# Patient Record
Sex: Male | Born: 1960
Health system: Southern US, Community
[De-identification: ages and names within clinical notes are randomized; demographics above are authoritative.]

## PROBLEM LIST (undated history)

## (undated) DIAGNOSIS — N200 Calculus of kidney: Secondary | ICD-10-CM

## (undated) DIAGNOSIS — I10 Essential (primary) hypertension: Secondary | ICD-10-CM

## (undated) DIAGNOSIS — N189 Chronic kidney disease, unspecified: Secondary | ICD-10-CM

## (undated) HISTORY — DX: Essential (primary) hypertension: I10

## (undated) HISTORY — DX: Chronic kidney disease, unspecified: N18.9

---

## 1999-12-13 ENCOUNTER — Emergency Department (HOSPITAL_COMMUNITY): Admission: EM | Admit: 1999-12-13 | Discharge: 1999-12-13 | Payer: Self-pay | Admitting: Emergency Medicine

## 1999-12-14 ENCOUNTER — Emergency Department (HOSPITAL_COMMUNITY): Admission: EM | Admit: 1999-12-14 | Discharge: 1999-12-14 | Payer: Self-pay | Admitting: Emergency Medicine

## 1999-12-17 ENCOUNTER — Encounter: Admission: RE | Admit: 1999-12-17 | Discharge: 1999-12-17 | Payer: Self-pay | Admitting: Hematology and Oncology

## 2007-10-06 ENCOUNTER — Emergency Department (HOSPITAL_BASED_OUTPATIENT_CLINIC_OR_DEPARTMENT_OTHER): Admission: EM | Admit: 2007-10-06 | Discharge: 2007-10-06 | Payer: Self-pay | Admitting: Emergency Medicine

## 2008-04-18 ENCOUNTER — Encounter: Admission: RE | Admit: 2008-04-18 | Discharge: 2008-04-18 | Payer: Self-pay | Admitting: Occupational Medicine

## 2010-10-29 LAB — DIFFERENTIAL
Basophils Absolute: 0.1
Basophils Relative: 2 — ABNORMAL HIGH
Eosinophils Absolute: 0.1
Eosinophils Relative: 1
Lymphocytes Relative: 13

## 2010-10-29 LAB — CBC
HCT: 46.9
MCHC: 35.1
MCV: 83.5
Platelets: 160
RDW: 12.2

## 2010-10-29 LAB — URINALYSIS, ROUTINE W REFLEX MICROSCOPIC
Bilirubin Urine: NEGATIVE
Ketones, ur: NEGATIVE
Leukocytes, UA: NEGATIVE
Nitrite: NEGATIVE
Protein, ur: 30 — AB
Urobilinogen, UA: 1

## 2010-10-29 LAB — BASIC METABOLIC PANEL
BUN: 18
CO2: 29
GFR calc non Af Amer: 55 — ABNORMAL LOW
Glucose, Bld: 144 — ABNORMAL HIGH
Potassium: 4.1

## 2011-11-09 DIAGNOSIS — E785 Hyperlipidemia, unspecified: Secondary | ICD-10-CM | POA: Insufficient documentation

## 2011-11-09 DIAGNOSIS — Z01810 Encounter for preprocedural cardiovascular examination: Secondary | ICD-10-CM | POA: Insufficient documentation

## 2011-11-09 DIAGNOSIS — I10 Essential (primary) hypertension: Secondary | ICD-10-CM | POA: Insufficient documentation

## 2017-04-01 ENCOUNTER — Ambulatory Visit: Payer: Self-pay | Admitting: Urgent Care

## 2017-04-01 ENCOUNTER — Emergency Department (HOSPITAL_COMMUNITY): Payer: Worker's Compensation

## 2017-04-01 ENCOUNTER — Encounter (HOSPITAL_COMMUNITY): Payer: Self-pay

## 2017-04-01 ENCOUNTER — Emergency Department (HOSPITAL_COMMUNITY)
Admission: EM | Admit: 2017-04-01 | Discharge: 2017-04-01 | Disposition: A | Discharge status: Home / Self Care | Payer: Worker's Compensation | Attending: Emergency Medicine | Admitting: Emergency Medicine

## 2017-04-01 DIAGNOSIS — Y939 Activity, unspecified: Secondary | ICD-10-CM | POA: Diagnosis not present

## 2017-04-01 DIAGNOSIS — W230XXA Caught, crushed, jammed, or pinched between moving objects, initial encounter: Secondary | ICD-10-CM | POA: Diagnosis not present

## 2017-04-01 DIAGNOSIS — Z23 Encounter for immunization: Secondary | ICD-10-CM | POA: Insufficient documentation

## 2017-04-01 DIAGNOSIS — S67192A Crushing injury of right middle finger, initial encounter: Secondary | ICD-10-CM | POA: Diagnosis present

## 2017-04-01 DIAGNOSIS — Y990 Civilian activity done for income or pay: Secondary | ICD-10-CM | POA: Diagnosis not present

## 2017-04-01 DIAGNOSIS — S6710XA Crushing injury of unspecified finger(s), initial encounter: Secondary | ICD-10-CM

## 2017-04-01 DIAGNOSIS — Y929 Unspecified place or not applicable: Secondary | ICD-10-CM | POA: Diagnosis not present

## 2017-04-01 MED ORDER — CEPHALEXIN 500 MG PO CAPS: 500.0000 mg | ORAL_CAPSULE | Freq: Two times a day (BID) | ORAL | 0 refills | Status: DC

## 2017-04-01 MED ORDER — BUPIVACAINE HCL (PF) 0.5 % IJ SOLN
20.0000 mL | Freq: Once | INTRAMUSCULAR | Status: AC
  Administered 2017-04-01: 20 mL
  Filled 2017-04-01: qty 20

## 2017-04-01 MED ORDER — TETANUS-DIPHTH-ACELL PERTUSSIS 5-2.5-18.5 LF-MCG/0.5 IM SUSP
0.5000 mL | Freq: Once | INTRAMUSCULAR | Status: AC
  Administered 2017-04-01: 0.5 mL via INTRAMUSCULAR
  Filled 2017-04-01: qty 0.5

## 2017-04-01 MED ORDER — PIPERACILLIN-TAZOBACTAM 3.375 G IVPB: 3.3750 g | Freq: Three times a day (TID) | INTRAVENOUS | Status: DC

## 2017-04-01 MED ORDER — OXYCODONE-ACETAMINOPHEN 5-325 MG PO TABS
1.0000 | ORAL_TABLET | ORAL | 0 refills | Status: DC | PRN
Start: 2017-04-01 — End: 2018-10-25

## 2017-04-01 MED ORDER — LIDOCAINE HCL 2 % IJ SOLN
20.0000 mL | Freq: Once | INTRAMUSCULAR | Status: AC
  Administered 2017-04-01: 400 mg
  Filled 2017-04-01: qty 20

## 2017-04-01 MED ORDER — PIPERACILLIN-TAZOBACTAM 3.375 G IVPB 30 MIN
3.3750 g | Freq: Once | INTRAVENOUS | Status: AC
Start: 2017-04-01 — End: 2017-04-01
  Administered 2017-04-01: 3.375 g via INTRAVENOUS
  Filled 2017-04-01: qty 50

## 2017-04-01 NOTE — H&P (Signed)
Reason for Consult:finger injury Referring Physician: ER  CC:I got my finger caught in a press  HPI:  John Morrison is an 57 y.o. rihgt handed male who presents with  Near amputation of RLF . Pt got finger caught in a pressing machine at work this afternoon .  Pain is rated at   10 /10 and is described as sharp.  Pain is constant.  Pain is made better by rest/immobilization, worse with motion.   Associated signs/symptoms: denies other injuries Previous treatment:    History reviewed. No pertinent past medical history.  History reviewed. No pertinent surgical history.  No family history on file.  Social History:  reports that  has never smoked. he has never used smokeless tobacco. He reports that he drinks alcohol. He reports that he does not use drugs.  Allergies: No Known Allergies  Medications: I have reviewed the patient's current medications.  No results found for this or any previous visit (from the past 48 hour(s)).  Dg Finger Middle Right  Result Date: 04/01/2017 CLINICAL DATA:  Patient with middle finger pain. Initial encounter. Injury. EXAM: RIGHT MIDDLE FINGER 2+V COMPARISON:  None. FINDINGS: Transverse fracture through the distal aspect of the distal phalanx of the middle finger with overlying soft tissue injury. There is mild volar displacement of the distal fracture fragment. IMPRESSION: Displaced transverse fracture through the distal aspect of the distal phalanx of the middle finger with overlying soft tissue injury. Electronically Signed   By: Lovey Newcomer M.D.   On: 04/01/2017 14:56    Pertinent items are noted in HPI. Temp:  [98.3 F (36.8 C)] 98.3 F (36.8 C) (03/07 1419) Pulse Rate:  [102] 102 (03/07 1419) Resp:  [16] 16 (03/07 1419) BP: (174)/(103) 174/103 (03/07 1419) SpO2:  [100 %] 100 % (03/07 1419) Weight:  [84.4 kg (186 lb)] 84.4 kg (186 lb) (03/07 1419) General appearance: alert and cooperative Resp: clear to auscultation bilaterally Cardio: regular rate  and rhythm GI: soft, non-tender; bowel sounds normal; no masses,  no organomegaly Extremities: extremities normal, atraumatic, no cyanosis or edema Except for RLF, with near amputation thru nail plate, finger tip hanging on by volar skin, open fracture of distal phalanx; skin flap pink  Assessment: Near amputation , crush injury to right long finger Plan: Will explore wound, fracture fixation, nail bed repair,  Finger tip repair I have discussed this treatment plan in detail with patient, including the risks of the recommended treatment or surgery, the benefits and the alternatives.  The patient understands that additional treatment may be necessary.  [place] C Kenneisha Cochrane 04/01/2017, 4:50 PM

## 2017-04-01 NOTE — Discharge Instructions (Signed)
Discharge Instructions:  Keep your dressing clean, dry and in place until instructed to remove by Dr. Kellan Raffield.  If the dressing becomes dirty or wet call the office for instructions during business hours. Elevate the extremity to help with swelling, this will also help with any discomfort. Take your medication as prescribed. No lifting with the injured  extremity. If you feel that the dressing is too tight, you may loosen it, but keep it on; finger tips should be pink; if there is a concern, call the office. [phone] Ice may be used if the injury is a fracture, do not apply ice directly to the skin. Please call the office on the next business day after discharge to arrange a follow up appointment.  Call [phone] between the hours of 9am - 5pm M-Th or 9am - 1pm on Fri. For most hand injuries and/or conditions, you may return to work using the uninjured hand (one handed duty) within 24-72 hours.  A detailed note will be provided to you at your follow up appointment or may contact the office prior to your follow up.    

## 2017-04-01 NOTE — ED Provider Notes (Signed)
[facility] EMERGENCY DEPARTMENT Provider Note   CSN: [id number] Arrival date & time: 04/01/17  1414     History   Chief Complaint Chief Complaint  Patient presents with  . Finger Injury    HPI John Morrison is a 57 y.o. male.  HPI   57 year old male presents today with complaints of injury to the right middle finger.  Patient notes he was at work and caught the distal aspect of his finger in a press.  He notes this happened at approximately 1:00.  Patient notes he is right-hand dominant, he had lunch at approximately 1230.  Uncertain tetanus status.   History reviewed. No pertinent past medical history.  There are no active problems to display for this patient.   History reviewed. No pertinent surgical history.     Home Medications    Prior to Admission medications   Medication Sig Start Date End Date Taking? Authorizing Provider  cephALEXin (KEFLEX) 500 MG capsule Take 1 capsule (500 mg total) by mouth 2 (two) times daily. 04/01/17   Asier Desroches, Dellis Filbert, PA-C  oxyCODONE-acetaminophen (PERCOCET/ROXICET) 5-325 MG tablet Take 1 tablet by mouth every 4 (four) hours as needed for severe pain. 04/01/17   Okey Regal, PA-C    Family History No family history on file.  Social History Social History   Tobacco Use  . Smoking status: Never Smoker  . Smokeless tobacco: Never Used  Substance Use Topics  . Alcohol use: Yes  . Drug use: No     Allergies   Patient has no known allergies.   Review of Systems Review of Systems  All other systems reviewed and are negative.    Physical Exam Updated Vital Signs BP (!) 165/98   Pulse 70   Temp 98.3 F (36.8 C) (Oral)   Resp 16   Ht 5\' 8"  (1.727 m)   Wt 84.4 kg (186 lb)   SpO2 100%   BMI 28.28 kg/m   Physical Exam  Constitutional: He is oriented to person, place, and time. He appears well-developed and well-nourished.  HENT:  Head: Normocephalic and atraumatic.  Eyes: Conjunctivae are normal.  Pupils are equal, round, and reactive to light. Right eye exhibits no discharge. Left eye exhibits no discharge. No scleral icterus.  Neck: Normal range of motion. No JVD present. No tracheal deviation present.  Pulmonary/Chest: Effort normal. No stridor.  Musculoskeletal:  See picture, DIP extension and flexion intact  Neurological: He is alert and oriented to person, place, and time. Coordination normal.  Psychiatric: He has a normal mood and affect. His behavior is normal. Judgment and thought content normal.  Nursing note and vitals reviewed.            ED Treatments / Results  Labs (all labs ordered are listed, but only abnormal results are displayed) Labs Reviewed - No data to display  EKG  EKG Interpretation None       Radiology Dg Finger Middle Right  Result Date: 04/01/2017 CLINICAL DATA:  Patient with middle finger pain. Initial encounter. Injury. EXAM: RIGHT MIDDLE FINGER 2+V COMPARISON:  None. FINDINGS: Transverse fracture through the distal aspect of the distal phalanx of the middle finger with overlying soft tissue injury. There is mild volar displacement of the distal fracture fragment. IMPRESSION: Displaced transverse fracture through the distal aspect of the distal phalanx of the middle finger with overlying soft tissue injury. Electronically Signed   By: Lovey Newcomer M.D.   On: 04/01/2017 14:56    Procedures .Nerve Block  Date/Time: 04/01/2017 3:15 PM Performed by: Okey Regal, PA-C Authorized by: Okey Regal, PA-C   Consent:    Consent obtained:  Verbal   Consent given by:  Patient   Risks discussed:  Allergic reaction and unsuccessful block   Alternatives discussed:  No treatment Indications:    Indications:  Pain relief Location:    Body area:  Upper extremity   Upper extremity nerve blocked: right middle finger    Laterality:  Left Pre-procedure details:    Skin preparation:  2% chlorhexidine Skin anesthesia (see MAR for exact dosages):     Skin anesthesia method:  None Procedure details (see MAR for exact dosages):    Block needle gauge:  27 G   Anesthetic injected:  Bupivacaine 0.5% w/o epi and lidocaine 2% w/o epi   Injection procedure:  Anatomic landmarks identified, incremental injection, introduced needle, anatomic landmarks palpated and negative aspiration for blood Post-procedure details:    Outcome:  Anesthesia achieved   Patient tolerance of procedure:  Tolerated well, no immediate complications   (including critical care time)  Medications Ordered in ED Medications  bupivacaine (MARCAINE) 0.5 % injection 20 mL (20 mLs Infiltration Given 04/01/17 1429)  lidocaine (XYLOCAINE) 2 % (with pres) injection 400 mg (400 mg Infiltration Given 04/01/17 1429)  Tdap (BOOSTRIX) injection 0.5 mL (0.5 mLs Intramuscular Given 04/01/17 1429)  piperacillin-tazobactam (ZOSYN) IVPB 3.375 g (0 g Intravenous Stopped 04/01/17 1714)     Initial Impression / Assessment and Plan / ED Course  I have reviewed the triage vital signs and the nursing notes.  Pertinent labs & imaging results that were available during my care of the patient were reviewed by me and considered in my medical decision making (see chart for details).      Final Clinical Impressions(s) / ED Diagnoses   Final diagnoses:  Crushing injury of finger, initial encounter    Labs:   Imaging: DG finger middle right  Consults: Hand surgery - [facility]   Therapeutics: lidocaine marcaine, zosyn   Discharge Meds: Keflex, Percocet  Assessment/Plan: 57 year old male presents today with a distal amputation of his right middle finger.  He is right-hand dominant.  Digital block was performed, wound was thoroughly cleansed and irrigated, Zosyn given.  Hand surgery consulted.  Hand surgery repaired injury at bedside recommend discharge with outpatient follow-up.  Patient given strict return precautions, verbalized understanding and agreement to today's plan.      ED  Discharge Orders        Ordered    cephALEXin (KEFLEX) 500 MG capsule  2 times daily     04/01/17 1712    oxyCODONE-acetaminophen (PERCOCET/ROXICET) 5-325 MG tablet  Every 4 hours PRN     04/01/17 1712       Okey Regal, PA-C 04/01/17 2110    Duffy Bruce, MD 04/02/17 [phone]

## 2017-04-01 NOTE — ED Notes (Signed)
Patient transported to X-ray 

## 2017-04-01 NOTE — ED Triage Notes (Signed)
Pt states he got right middle finger stuck in brake press and has partial amputation to finger. Bleeding in triage and sterile dressing applied

## 2017-04-01 NOTE — ED Provider Notes (Signed)
Patient placed in Quick Look pathway, seen and evaluated   Chief Complaint: finger injury  HPI:   57 y.o. male was at work when a brake press came down on his right middle finger causing a partial amputation of the distal aspect. Patient c/o pain and bleeding. Unsure of last tetanus.   ROS: M/S: right middle finger injury  Physical Exam:   Gen: No distress  Neuro: Awake and Alert  Skin: partial amputation of distal aspect of  the right middle finger.     Focused Exam:    Initiation of care has begun. The patient has been counseled on the process, plan, and necessity for staying for the completion/evaluation, and the remainder of the medical screening examination    Ashley Murrain, NP 04/01/17 1430    Duffy Bruce, MD 04/02/17 [phone]

## 2017-04-01 NOTE — Progress Notes (Signed)
Pharmacy Antibiotic Note  John Morrison is a 57 y.o. male admitted on 04/01/2017 with partial finger amputation.  Pharmacy has been consulted for Zosyn dosing.  Plan: Zosyn 3.375g IV q8h (4 hour infusion).  Height: 5\' 8"  (172.7 cm) Weight: 186 lb (84.4 kg) IBW/kg (Calculated) : 68.4  Temp (24hrs), Avg:98.3 F (36.8 C), Min:98.3 F (36.8 C), Max:98.3 F (36.8 C)  No results for input(s): WBC, CREATININE, LATICACIDVEN, VANCOTROUGH, VANCOPEAK, VANCORANDOM, GENTTROUGH, GENTPEAK, GENTRANDOM, TOBRATROUGH, TOBRAPEAK, TOBRARND, AMIKACINPEAK, AMIKACINTROU, AMIKACIN in the last 168 hours.  CrCl cannot be calculated (No order found.).    No Known Allergies  Thank you for allowing pharmacy to be a part of this patient's care.  John Morrison John Morrison 04/01/2017 3:18 PM

## 2018-10-25 ENCOUNTER — Other Ambulatory Visit: Payer: Self-pay

## 2018-10-25 ENCOUNTER — Encounter (HOSPITAL_BASED_OUTPATIENT_CLINIC_OR_DEPARTMENT_OTHER): Payer: Self-pay

## 2018-10-25 ENCOUNTER — Emergency Department (HOSPITAL_BASED_OUTPATIENT_CLINIC_OR_DEPARTMENT_OTHER)
Admission: EM | Admit: 2018-10-25 | Discharge: 2018-10-25 | Disposition: A | Discharge status: Home / Self Care | Payer: Self-pay | Attending: Emergency Medicine | Admitting: Emergency Medicine

## 2018-10-25 ENCOUNTER — Emergency Department (HOSPITAL_BASED_OUTPATIENT_CLINIC_OR_DEPARTMENT_OTHER): Payer: Self-pay

## 2018-10-25 DIAGNOSIS — N200 Calculus of kidney: Secondary | ICD-10-CM | POA: Insufficient documentation

## 2018-10-25 HISTORY — DX: Calculus of kidney: N20.0

## 2018-10-25 LAB — CBC
HCT: 45.5 % (ref 39.0–52.0)
Hemoglobin: 15.5 g/dL (ref 13.0–17.0)
MCH: 28.7 pg (ref 26.0–34.0)
MCHC: 34.1 g/dL (ref 30.0–36.0)
MCV: 84.1 fL (ref 80.0–100.0)
Platelets: 170 10*3/uL (ref 150–400)
RBC: 5.41 MIL/uL (ref 4.22–5.81)
RDW: 13.2 % (ref 11.5–15.5)
WBC: 9.8 10*3/uL (ref 4.0–10.5)
nRBC: 0 % (ref 0.0–0.2)

## 2018-10-25 LAB — URINALYSIS, ROUTINE W REFLEX MICROSCOPIC
Bilirubin Urine: NEGATIVE
Glucose, UA: NEGATIVE mg/dL
Ketones, ur: 15 mg/dL — AB
Leukocytes,Ua: NEGATIVE
Nitrite: NEGATIVE
Protein, ur: 30 mg/dL — AB
Specific Gravity, Urine: 1.025 (ref 1.005–1.030)
pH: 6.5 (ref 5.0–8.0)

## 2018-10-25 LAB — URINALYSIS, MICROSCOPIC (REFLEX): RBC / HPF: >50 RBC/hpf (ref 0–5)

## 2018-10-25 LAB — BASIC METABOLIC PANEL
Anion gap: 9 (ref 5–15)
BUN: 23 mg/dL — ABNORMAL HIGH (ref 6–20)
CO2: 26 mmol/L (ref 22–32)
Calcium: 9.5 mg/dL (ref 8.9–10.3)
Chloride: 104 mmol/L (ref 98–111)
Creatinine, Ser: 1.42 mg/dL — ABNORMAL HIGH (ref 0.61–1.24)
GFR calc Af Amer: >60 mL/min (ref >60)
GFR calc non Af Amer: 55 mL/min — ABNORMAL LOW (ref >60)
Glucose, Bld: 159 mg/dL — ABNORMAL HIGH (ref 70–99)
Potassium: 4.4 mmol/L (ref 3.5–5.1)
Sodium: 139 mmol/L (ref 135–145)

## 2018-10-25 MED ORDER — TAMSULOSIN HCL 0.4 MG PO CAPS: 0.4000 mg | ORAL_CAPSULE | Freq: Every day | ORAL | 0 refills | Status: DC

## 2018-10-25 MED ORDER — ONDANSETRON 4 MG PO TBDP: 4.0000 mg | ORAL_TABLET | Freq: Three times a day (TID) | ORAL | 0 refills | Status: DC | PRN

## 2018-10-25 MED ORDER — OXYCODONE-ACETAMINOPHEN 5-325 MG PO TABS: 1.0000 | ORAL_TABLET | ORAL | 0 refills | Status: DC | PRN

## 2018-10-25 MED ORDER — FENTANYL CITRATE (PF) 100 MCG/2ML IJ SOLN
100.0000 ug | Freq: Once | INTRAMUSCULAR | Status: AC
  Administered 2018-10-25: 100 ug via INTRAVENOUS
  Filled 2018-10-25: qty 2

## 2018-10-25 MED ORDER — OXYCODONE-ACETAMINOPHEN 5-325 MG PO TABS
1.0000 | ORAL_TABLET | Freq: Once | ORAL | Status: AC
  Administered 2018-10-25: 1 via ORAL
  Filled 2018-10-25: qty 1

## 2018-10-25 NOTE — ED Provider Notes (Signed)
[facility] EMERGENCY DEPARTMENT Provider Note   CSN: [id number] Arrival date & time: 10/25/18  1437     History   Chief Complaint Chief Complaint  Patient presents with  . Flank Pain    HPI John Morrison is a 58 y.o. male.     58yo M w/ h/o kidney stones who p/w L flank pain. Earlier this afternoon around 1pm, he began having L back/flank pain radiating to L side that was initially severe and constant, now coming and going in waves. He reports not being able to sit still, nothing makes it better or worse. No N/V, diarrhea, fever, cough/cold symptoms, urinary symptoms, CP, or SOB. No medications PTA.   Reports distant h/o kidney stone 2009 that passed spontaneously.   The history is provided by the patient.  Flank Pain    Past Medical History:  Diagnosis Date  . Kidney stone     There are no active problems to display for this patient.   History reviewed. No pertinent surgical history.      Home Medications    Prior to Admission medications   Medication Sig Start Date End Date Taking? Authorizing Provider  ondansetron (ZOFRAN ODT) 4 MG disintegrating tablet Take 1 tablet (4 mg total) by mouth every 8 (eight) hours as needed for nausea or vomiting. 10/25/18   Theo Reither, Wenda Overland, MD  oxyCODONE-acetaminophen (PERCOCET) 5-325 MG tablet Take 1 tablet by mouth every 4 (four) hours as needed. 10/25/18   Latisha Lasch, Wenda Overland, MD  tamsulosin (FLOMAX) 0.4 MG CAPS capsule Take 1 capsule (0.4 mg total) by mouth daily. 10/25/18   Jamica Woodyard, Wenda Overland, MD    Family History No family history on file.  Social History Social History   Tobacco Use  . Smoking status: Never Smoker  . Smokeless tobacco: Never Used  Substance Use Topics  . Alcohol use: Not Currently  . Drug use: No     Allergies   Patient has no known allergies.   Review of Systems Review of Systems  Genitourinary: Positive for flank pain.  All other systems reviewed and are negative  except that which was mentioned in HPI    Physical Exam Updated Vital Signs BP 119/77   Pulse (!) 52   Temp 98.2 F (36.8 C) (Oral)   Resp 14   Ht 5\' 8"  (1.727 m)   Wt 75.3 kg   SpO2 99%   BMI 25.24 kg/m   Physical Exam Vitals signs and nursing note reviewed.  Constitutional:      General: He is not in acute distress.    Appearance: He is well-developed.  HENT:     Head: Normocephalic and atraumatic.  Eyes:     Conjunctiva/sclera: Conjunctivae normal.  Neck:     Musculoskeletal: Neck supple.  Cardiovascular:     Rate and Rhythm: Normal rate and regular rhythm.     Heart sounds: Normal heart sounds. No murmur.  Pulmonary:     Effort: Pulmonary effort is normal.     Breath sounds: Normal breath sounds.  Abdominal:     General: Bowel sounds are normal. There is no distension.     Palpations: Abdomen is soft.     Tenderness: There is abdominal tenderness (LLQ). There is no guarding or rebound.  Skin:    General: Skin is warm and dry.  Neurological:     Mental Status: He is alert and oriented to person, place, and time.     Comments: Fluent speech  Psychiatric:  Judgment: Judgment normal.      ED Treatments / Results  Labs (all labs ordered are listed, but only abnormal results are displayed) Labs Reviewed  URINALYSIS, ROUTINE W REFLEX MICROSCOPIC - Abnormal; Notable for the following components:      Result Value   Color, Urine BROWN (*)    APPearance CLOUDY (*)    Hgb urine dipstick LARGE (*)    Ketones, ur 15 (*)    Protein, ur 30 (*)    All other components within normal limits  BASIC METABOLIC PANEL - Abnormal; Notable for the following components:   Glucose, Bld 159 (*)    BUN 23 (*)    Creatinine, Ser 1.42 (*)    GFR calc non Af Amer 55 (*)    All other components within normal limits  URINALYSIS, MICROSCOPIC (REFLEX) - Abnormal; Notable for the following components:   Bacteria, UA RARE (*)    All other components within normal limits  CBC     EKG None  Radiology Ct Renal Stone Study  Result Date: 10/25/2018 CLINICAL DATA:  LEFT flank pain radiating to abdomen beginning this afternoon suspected kidney stone disease, history kidney stones EXAM: CT ABDOMEN AND PELVIS WITHOUT CONTRAST TECHNIQUE: Multidetector CT imaging of the abdomen and pelvis was performed following the standard protocol without IV contrast. Sagittal and coronal MPR images reconstructed from axial data set. Oral contrast not administered for this indication. COMPARISON:  10/06/2007 FINDINGS: Lower chest: Lung bases clear Hepatobiliary: Gallbladder and liver normal appearance Pancreas: Normal appearance Spleen: Normal appearance Adrenals/Urinary Tract: Adrenal glands normal appearance. LEFT hydronephrosis and perinephric edema secondary to a 5 x 4 mm proximal LEFT ureteral calculus. Additional tiny nonobstructing BILATERAL renal calculi. No RIGHT hydronephrosis. No renal masses. Remaining ureters decompressed. Bladder unremarkable. Stomach/Bowel: Normal appendix. Stomach and bowel loops normal appearance. For technique. Vascular/Lymphatic: Atherosclerotic calcifications aorta and iliac arteries without aneurysm. No adenopathy. Reproductive: Unremarkable prostate gland and seminal vesicles Other: No free air or free fluid. No hernia or additional inflammatory process. Musculoskeletal: BILATERAL spondylolysis L4 with minimal grade 1 anterolisthesis L4-L5. Disc space narrowing L4-L5. IMPRESSION: LEFT hydronephrosis secondary to a 5 x 4 mm proximal LEFT ureteral calculus. Additional tiny BILATERAL nonobstructing renal calculi. Grade 1 anterolisthesis L4-L5 secondary to BILATERAL spondylolysis L4. Electronically Signed   By: Lavonia Dana M.D.   On: 10/25/2018 17:28    Procedures Procedures (including critical care time)  Medications Ordered in ED Medications  fentaNYL (SUBLIMAZE) injection 100 mcg (100 mcg Intravenous Given 10/25/18 1714)  oxyCODONE-acetaminophen  (PERCOCET/ROXICET) 5-325 MG per tablet 1 tablet (1 tablet Oral Given 10/25/18 1822)     Initial Impression / Assessment and Plan / ED Course  I have reviewed the triage vital signs and the nursing notes.  Pertinent labs & imaging results that were available during my care of the patient were reviewed by me and considered in my medical decision making (see chart for details).        Uncomfortable but nontoxic on exam, some left lower quadrant tenderness noted.  Vital signs reassuring.  UA shows blood without evidence of infection.  Creatinine today is 1.42 which is similar to creatinine of 1.4 previously.  Normal CBC.  CT renal study shows left hydronephrosis with 5 mm proximal left ureteral stone.  On reassessment after above medications, patient stated that he felt much better.  He has had no vomiting.  I discussed supportive measures and provided pain medication and nausea medication for home.  Instructed him to contact urology  for follow-up appointment.  I have reviewed return precautions and he voiced understanding.  Final Clinical Impressions(s) / ED Diagnoses   Final diagnoses:  Kidney stone    ED Discharge Orders         Ordered    oxyCODONE-acetaminophen (PERCOCET) 5-325 MG tablet  Every 4 hours PRN     10/25/18 1902    ondansetron (ZOFRAN ODT) 4 MG disintegrating tablet  Every 8 hours PRN     10/25/18 1902    tamsulosin (FLOMAX) 0.4 MG CAPS capsule  Daily     10/25/18 1902           Sarahlynn Cisnero, Wenda Overland, MD 10/25/18 2334

## 2018-10-25 NOTE — ED Triage Notes (Signed)
Pt c/o left flank pain since 1pm-states feels like a kidney stone-grimacing

## 2018-10-25 NOTE — ED Notes (Signed)
Patient transported to CT 

## 2018-10-25 NOTE — ED Notes (Signed)
Left flank pain radiating to abd onset this pm

## 2018-10-25 NOTE — ED Notes (Signed)
ED Provider at bedside. 

## 2019-04-25 LAB — CBC AND DIFFERENTIAL: WBC: 5.8

## 2020-02-15 DIAGNOSIS — Z23 Encounter for immunization: Secondary | ICD-10-CM | POA: Diagnosis not present

## 2020-03-15 DIAGNOSIS — Z23 Encounter for immunization: Secondary | ICD-10-CM | POA: Diagnosis not present

## 2020-03-18 ENCOUNTER — Encounter: Payer: Self-pay | Admitting: Family Medicine

## 2020-03-25 ENCOUNTER — Encounter: Payer: Self-pay | Admitting: Family Medicine

## 2020-03-25 DIAGNOSIS — N200 Calculus of kidney: Secondary | ICD-10-CM | POA: Insufficient documentation

## 2020-03-26 ENCOUNTER — Other Ambulatory Visit: Payer: Self-pay

## 2020-03-27 ENCOUNTER — Encounter: Payer: Self-pay | Admitting: Family Medicine

## 2020-03-27 ENCOUNTER — Ambulatory Visit (INDEPENDENT_AMBULATORY_CARE_PROVIDER_SITE_OTHER): Payer: BC Managed Care – PPO | Admitting: Family Medicine

## 2020-03-27 VITALS — BP 168/84 | HR 88 | Temp 98.0°F | Ht 64.75 in | Wt 164.0 lb

## 2020-03-27 DIAGNOSIS — I10 Essential (primary) hypertension: Secondary | ICD-10-CM | POA: Diagnosis not present

## 2020-03-27 DIAGNOSIS — Z125 Encounter for screening for malignant neoplasm of prostate: Secondary | ICD-10-CM

## 2020-03-27 DIAGNOSIS — Z0000 Encounter for general adult medical examination without abnormal findings: Secondary | ICD-10-CM | POA: Diagnosis not present

## 2020-03-27 DIAGNOSIS — Z1211 Encounter for screening for malignant neoplasm of colon: Secondary | ICD-10-CM

## 2020-03-27 DIAGNOSIS — Z131 Encounter for screening for diabetes mellitus: Secondary | ICD-10-CM

## 2020-03-27 DIAGNOSIS — Z7689 Persons encountering health services in other specified circumstances: Secondary | ICD-10-CM | POA: Diagnosis not present

## 2020-03-27 DIAGNOSIS — G8929 Other chronic pain: Secondary | ICD-10-CM | POA: Insufficient documentation

## 2020-03-27 DIAGNOSIS — E782 Mixed hyperlipidemia: Secondary | ICD-10-CM | POA: Diagnosis not present

## 2020-03-27 DIAGNOSIS — M545 Low back pain, unspecified: Secondary | ICD-10-CM | POA: Insufficient documentation

## 2020-03-27 DIAGNOSIS — M4316 Spondylolisthesis, lumbar region: Secondary | ICD-10-CM | POA: Insufficient documentation

## 2020-03-27 MED ORDER — LISINOPRIL 20 MG PO TABS: 20.0000 mg | ORAL_TABLET | Freq: Every day | ORAL | 0 refills | Status: DC

## 2020-03-27 NOTE — Patient Instructions (Signed)

## 2020-03-27 NOTE — Progress Notes (Signed)
Patient ID: John Morrison, male  DOB: 15-Jul-1960, 60 y.o.   MRN: [id number] Patient Care Team    Relationship Specialty Notifications Start End  Ma Hillock, DO PCP - General Family Medicine  03/27/20     Chief Complaint  Patient presents with  . Establish Care    Pt dx with Pre-hypertension never on meds; No refills at this time     Subjective:  John Morrison is a 60 y.o.  male present for new patient establishment/cmc. All past medical history, surgical history, allergies, family history, immunizations, medications and social history were updated in the electronic medical record today. All recent labs, ED visits and hospitalizations within the last year were reviewed.  Health maintenance: pt adopted at age 25- not much fhx is known for certain.  Colonoscopy: never. Counseled> pt desires cologuard. He understands if positive test result will need colonoscopy.  Immunizations: tdap UTD 2019, Influenza declined (encouraged yearly), covid series completed,  shingles declined for now Infectious disease screening: HIV and  Hep C declined today PSA: No results found for: PSA Assistive device: none Oxygen UDJ:SHFW Patient has a Dental home. Hospitalizations/ED visits:reviewed  Depression screen [facility] 2/9 03/27/2020  Decreased Interest 0  Down, Depressed, Hopeless 0  PHQ - 2 Score 0   No flowsheet data found.     No flowsheet data found.  Immunization History  Administered Date(s) Administered  . Moderna Sars-Covid-2 Vaccination 02/15/2020, 03/15/2020  . Tdap 04/01/2017   No exam data present  Past Medical History:  Diagnosis Date  . CKD (chronic kidney disease)   . Hypertension   . Kidney stone    No Known Allergies Past Surgical History:  Procedure Laterality Date  . NO PAST SURGERIES     Family History  Adopted: Yes  Family history unknown: Yes   Social History   Social History Narrative   Marital status/children/pets: Married   Education/employment: High  school education, works as a Glass blower/designer.   Safety:      -smoke alarm in the home:Yes     - wears seatbelt: Yes     - Feels safe in their relationships: Yes    Allergies as of 03/27/2020   No Known Allergies     Medication List       Accurate as of March 27, 2020  2:06 PM. If you have any questions, ask your nurse or doctor.        STOP taking these medications   ondansetron 4 MG disintegrating tablet Commonly known as: Zofran ODT Stopped by: Howard Pouch, DO   oxyCODONE-acetaminophen 5-325 MG tablet Commonly known as: Percocet Stopped by: Howard Pouch, DO   tamsulosin 0.4 MG Caps capsule Commonly known as: FLOMAX Stopped by: Howard Pouch, DO     TAKE these medications   lisinopril 20 MG tablet Commonly known as: ZESTRIL Take 1 tablet (20 mg total) by mouth daily. Started by: Howard Pouch, DO       All past medical history, surgical history, allergies, family history, immunizations andmedications were updated in the EMR today and reviewed under the history and medication portions of their EMR.    No results found for this or any previous visit (from the past 2160 hour(s)).   ROS: 14 pt review of systems performed and negative (unless mentioned in an HPI)  Objective: BP (!) 168/84   Pulse 88   Temp 98 F (36.7 C) (Oral)   Ht 5' 4.75" (1.645 m)   Wt 164  lb (74.4 kg)   SpO2 98%   BMI 27.50 kg/m  Gen: Afebrile. No acute distress. Nontoxic in appearance, well-developed, well-nourished,  Pleasant male.  HENT: AT. [place]. poor dentition.  Bilateral TM visualized and normal in appearance, normal external auditory canal. MMM, no oral lesions, adequate dentition. Bilateral nares within normal limits. Throat without erythema, ulcerations or exudates. no Cough on exam, no hoarseness on exam. Eyes:Pupils Equal Round Reactive to light, Extraocular movements intact,  Conjunctiva without redness, discharge or icterus. Neck/lymp/endocrine: Supple,no lymphadenopathy, no  thyromegaly CV: RRR no murmur, no edema, +2/4 P posterior tibialis pulses.  Chest: CTAB, no wheeze, rhonchi or crackles. normal Respiratory effort. good Air movement. Abd: Soft. falt. NTND. BS present. no Masses palpated. No hepatosplenomegaly. No rebound tenderness or guarding. Skin: no rashes, purpura or petechiae. Warm and well-perfused. Skin intact. Neuro/Msk:  Normal gait. PERLA. EOMi. Alert. Oriented x3.  Cranial nerves II through XII intact. Muscle strength 5/5 upper/lower extremity. DTRs equal bilaterally. Psych: Normal affect, dress and demeanor. Normal speech. Normal thought content and judgment.   Assessment/plan: John Morrison is a 60 y.o. male present for est care/cpe/CMC Establishing care with new doctor, encounter for Primary hypertension/HLD Start lisinopril 20 mg QD.  Low sodium diet- heart healthy Encouraged him to establish with dental team for poor dentition.  - CBC w/Diff - Comp Met (CMET) - TSH - Lipid panel - 3-4 weeks follow for BP recheck.   Diabetes mellitus screening - Hemoglobin A1c Prostate cancer screening - PSA Colon cancer screening - Cologuard  Anterolisthesis of lumbar spine/Spondylolisthesis of lumbar region/Chronic bilateral low back pain without sciatica He had FMLA paperwork filled out in the past by [employer] (many years ago). Agreed to fill out for him.  Routine general medical examination at a health care facility Patient was encouraged to exercise greater than 150 minutes a week. Patient was encouraged to choose a diet filled with fresh fruits and vegetables, and lean meats. AVS provided to patient today for education/recommendation on gender specific health and safety maintenance. Colonoscopy: never. Counseled> pt desires cologuard. He understands if positive test result will need colonoscopy.  Immunizations: tdap UTD 2019, Influenza declined (encouraged yearly), covid series completed,  shingles declined for now Infectious disease screening:  HIV and  Hep C declined today PSA: No results found for: PSA  Return in about 3 weeks (around 04/17/2020) for CMC (30 min).  Orders Placed This Encounter  Procedures  . CBC and differential  . PSA  . CBC w/Diff  . Comp Met (CMET)  . TSH  . Lipid panel  . Hemoglobin A1c  . Cologuard   Meds ordered this encounter  Medications  . lisinopril (ZESTRIL) 20 MG tablet    Sig: Take 1 tablet (20 mg total) by mouth daily.    Dispense:  90 tablet    Refill:  0   Referral Orders  No referral(s) requested today     Note is dictated utilizing voice recognition software. Although note has been proof read prior to signing, occasional typographical errors still can be missed. If any questions arise, please do not hesitate to call for verification.  Electronically signed by: Howard Pouch, DO [place]

## 2020-03-28 LAB — COMPREHENSIVE METABOLIC PANEL
AG Ratio: 1.6 (calc) (ref 1.0–2.5)
ALT: 13 U/L (ref 9–46)
AST: 14 U/L (ref 10–35)
Albumin: 4.3 g/dL (ref 3.6–5.1)
Alkaline phosphatase (APISO): 65 U/L (ref 35–144)
BUN: 24 mg/dL (ref 7–25)
CO2: 25 mmol/L (ref 20–32)
Calcium: 9.6 mg/dL (ref 8.6–10.3)
Chloride: 106 mmol/L (ref 98–110)
Creat: 1.1 mg/dL (ref 0.70–1.33)
Globulin: 2.7 g/dL (calc) (ref 1.9–3.7)
Glucose, Bld: 79 mg/dL (ref 65–99)
Potassium: 3.9 mmol/L (ref 3.5–5.3)
Sodium: 141 mmol/L (ref 135–146)
Total Bilirubin: 0.5 mg/dL (ref 0.2–1.2)
Total Protein: 7 g/dL (ref 6.1–8.1)

## 2020-03-28 LAB — CBC WITH DIFFERENTIAL/PLATELET
Absolute Monocytes: 332 cells/uL (ref 200–950)
Basophils Absolute: 61 cells/uL (ref 0–200)
Basophils Relative: 1.2 %
Eosinophils Absolute: 143 cells/uL (ref 15–500)
Eosinophils Relative: 2.8 %
HCT: 44.2 % (ref 38.5–50.0)
Hemoglobin: 15.5 g/dL (ref 13.2–17.1)
Lymphs Abs: 1346 cells/uL (ref 850–3900)
MCH: 29 pg (ref 27.0–33.0)
MCHC: 35.1 g/dL (ref 32.0–36.0)
MCV: 82.6 fL (ref 80.0–100.0)
MPV: 12.3 fL (ref 7.5–12.5)
Monocytes Relative: 6.5 %
Neutro Abs: 3218 cells/uL (ref 1500–7800)
Neutrophils Relative %: 63.1 %
Platelets: 158 10*3/uL (ref 140–400)
RBC: 5.35 10*6/uL (ref 4.20–5.80)
RDW: 13.7 % (ref 11.0–15.0)
Total Lymphocyte: 26.4 %
WBC: 5.1 10*3/uL (ref 3.8–10.8)

## 2020-03-28 LAB — LIPID PANEL
Cholesterol: 146 mg/dL (ref <200)
HDL: 40 mg/dL (ref >=40)
LDL Cholesterol (Calc): 84 mg/dL (calc)
Non-HDL Cholesterol (Calc): 106 mg/dL (calc) (ref <130)
Total CHOL/HDL Ratio: 3.7 (calc) (ref <5.0)
Triglycerides: 127 mg/dL (ref <150)

## 2020-03-28 LAB — PSA: PSA: 0.57 ng/mL (ref <=4.0)

## 2020-03-28 LAB — HEMOGLOBIN A1C
Hgb A1c MFr Bld: 4.9 % of total Hgb (ref <5.7)
Mean Plasma Glucose: 94 mg/dL
eAG (mmol/L): 5.2 mmol/L

## 2020-03-28 LAB — TSH: TSH: 2.54 mIU/L (ref 0.40–4.50)

## 2020-03-29 ENCOUNTER — Telehealth: Payer: Self-pay | Admitting: Family Medicine

## 2020-03-29 NOTE — Telephone Encounter (Signed)
Filled out appropriate portion and placed on PCP desk

## 2020-03-29 NOTE — Telephone Encounter (Signed)
Patient's wife dropped off FMLA forms to be filled out. Placed in Dr. Lucita Lora inbox with signed request forms.

## 2020-04-04 DIAGNOSIS — Z0279 Encounter for issue of other medical certificate: Secondary | ICD-10-CM

## 2020-04-04 NOTE — Telephone Encounter (Signed)
Completed FMLA forms to the best of my ability.  Paperwork was placed in [place] work [employer].  Please provide patient if his original past medical history papers he supplied to [place].

## 2020-04-05 NOTE — Telephone Encounter (Signed)
Not currently in office. Please advise

## 2020-04-05 NOTE — Telephone Encounter (Signed)
FMLA forms faxed and original paperwork mailed to pt.

## 2020-04-16 DIAGNOSIS — Z1211 Encounter for screening for malignant neoplasm of colon: Secondary | ICD-10-CM | POA: Diagnosis not present

## 2020-04-17 ENCOUNTER — Ambulatory Visit (INDEPENDENT_AMBULATORY_CARE_PROVIDER_SITE_OTHER): Payer: BC Managed Care – PPO

## 2020-04-17 ENCOUNTER — Other Ambulatory Visit: Payer: Self-pay

## 2020-04-17 ENCOUNTER — Ambulatory Visit: Payer: BC Managed Care – PPO | Admitting: Family Medicine

## 2020-04-17 VITALS — BP 148/84 | HR 56

## 2020-04-17 DIAGNOSIS — I10 Essential (primary) hypertension: Secondary | ICD-10-CM | POA: Diagnosis not present

## 2020-04-17 NOTE — Patient Instructions (Signed)
Your Blood Pressure was 164/84 last time.  Slowly improving we will call you with other instructions

## 2020-04-17 NOTE — Progress Notes (Signed)
John Morrison is a 60 y.o. male presents to the office today for blood pressure check with a BP of 148/84 and pulse 56. Last OV pt started  lisinopril 20 mg QD and had taken BP meds approx. 3 hours prior to visit with no complaints. Pt will await from further instructions from provider for any changes.  O: BP (!) 148/84   Pulse (!) 56   A/P: John Morrison is a 60 y.o. male present for BP check after starting Lisinopril 20 mg QD for new onset hypertension on his new patient establishment 03/27/2020. Benign hypertension: -BP much improved, still above goal of < 130/80. -Patient instructed to increase lisinopril to 40 mg daily.  He can finish the prescription bottle he currently has by taking 2 tabs daily in the morning.  New prescription called in for lisinopril 40 mg tab-1 tab daily with new prescription. -Follow-up mid- September for chronic medical condition follow-up.  Return in about 6 months (around 10/07/2020) for [place] (30 min).  Meds ordered this encounter  Medications  . lisinopril (ZESTRIL) 40 MG tablet    Sig: Take 1 tablet (40 mg total) by mouth daily.    Dispense:  90 tablet    Refill:  1    Electronically Signed by: Howard Pouch, DO [place] [facility]- OR  _______________________________________________________ [name]: Please call with above instructions.  Thanks.

## 2020-04-18 ENCOUNTER — Telehealth: Payer: Self-pay

## 2020-04-18 MED ORDER — LISINOPRIL 40 MG PO TABS: 40.0000 mg | ORAL_TABLET | Freq: Every day | ORAL | 1 refills | Status: DC

## 2020-04-18 NOTE — Telephone Encounter (Signed)
error 

## 2020-04-18 NOTE — Addendum Note (Signed)
Addended by: Howard Pouch A on: 04/18/2020 07:39 AM   Modules accepted: Orders, Level of Service

## 2020-04-18 NOTE — Progress Notes (Signed)
LVM for pt to CB for instructions

## 2020-04-18 NOTE — Progress Notes (Signed)
Spoke with pt with provider instructions

## 2020-04-21 LAB — COLOGUARD: Cologuard: POSITIVE — AB

## 2020-04-22 ENCOUNTER — Telehealth: Payer: Self-pay | Admitting: Family Medicine

## 2020-04-22 DIAGNOSIS — R195 Other fecal abnormalities: Secondary | ICD-10-CM

## 2020-04-22 NOTE — Telephone Encounter (Signed)
LM for pt to return call to discuss.  

## 2020-04-22 NOTE — Telephone Encounter (Signed)
Please inform patient of positive cologuard testing. A positive result does not necessarily mean cancer. It means that Cologuard detected DNA and/or  biomarkers in the stool which are associated with colon cancer or precancer. Patients with a positive result should have a diagnostic colonoscopy. Not investigating further now, could result in missed opportunity to diagnose and treat colon cancer or precancer.  I have referred him to gastroenterology office in [facility].  He should be receiving a call from them to establish.

## 2020-04-23 NOTE — Telephone Encounter (Signed)
Spoke with pt regarding labs and instructions.   

## 2020-04-24 ENCOUNTER — Encounter: Payer: Self-pay | Admitting: Internal Medicine

## 2020-05-30 ENCOUNTER — Telehealth: Payer: Self-pay | Admitting: *Deleted

## 2020-05-30 ENCOUNTER — Ambulatory Visit: Payer: BC Managed Care – PPO | Admitting: Internal Medicine

## 2020-05-30 ENCOUNTER — Other Ambulatory Visit: Payer: Self-pay

## 2020-05-30 ENCOUNTER — Encounter: Payer: Self-pay | Admitting: Internal Medicine

## 2020-05-30 VITALS — BP 146/88 | HR 57 | Temp 97.7°F | Ht 65.0 in | Wt 165.0 lb

## 2020-05-30 DIAGNOSIS — R195 Other fecal abnormalities: Secondary | ICD-10-CM

## 2020-05-30 NOTE — Telephone Encounter (Signed)
Called pt, LMOVM to call back to scheduled TCS with propofol, Dr. Abbey Chatters, ASA 2

## 2020-05-30 NOTE — Progress Notes (Signed)
Primary Care Physician:  Ma Hillock, DO Primary Gastroenterologist:  Dr. Abbey Chatters  Chief Complaint  Patient presents with  . positive cologuard    Never had tcs. No fhcrc    HPI:   John WILTSEY is a 60 y.o. male who presents to the clinic today by referral from his PCP Dr. Raoul Pitch for evaluation.  Patient recently underwent Cologuard testing for colon cancer screening and came back positive.  Denies any melena hematochezia.  No unintentional weight loss.  No abdominal pain.  Denies any family history of colorectal malignancy.  She denies any chronic reflux or heartburn.  No dysphagia odynophagia.  Past Medical History:  Diagnosis Date  . CKD (chronic kidney disease)   . Hypertension   . Kidney stone     Past Surgical History:  Procedure Laterality Date  . NO PAST SURGERIES      Current Outpatient Medications  Medication Sig Dispense Refill  . lisinopril (ZESTRIL) 40 MG tablet Take 1 tablet (40 mg total) by mouth daily. 90 tablet 1   No current facility-administered medications for this visit.    Allergies as of 05/30/2020  . (No Known Allergies)    Family History  Adopted: Yes  Family history unknown: Yes    Social History   Socioeconomic History  . Marital status: Married    Spouse name: Not on file  . Number of children: Not on file  . Years of education: Not on file  . Highest education level: Not on file  Occupational History  . Not on file  Tobacco Use  . Smoking status: Never Smoker  . Smokeless tobacco: Current User    Types: Chew, Snuff  Vaping Use  . Vaping Use: Never used  Substance and Sexual Activity  . Alcohol use: Not Currently  . Drug use: No  . Sexual activity: Not Currently    Partners: Female  Other Topics Concern  . Not on file  Social History Narrative   Marital status/children/pets: Married   Education/employment: High school education, works as a Glass blower/designer.   Safety:      -smoke alarm in the home:Yes     - wears  seatbelt: Yes     - Feels safe in their relationships: Yes   Social Determinants of Health   Financial Resource Strain: Not on file  Food Insecurity: Not on file  Transportation Needs: Not on file  Physical Activity: Not on file  Stress: Not on file  Social Connections: Not on file  Intimate Partner Violence: Not on file    Subjective: Review of Systems  Constitutional: Negative for chills and fever.  HENT: Negative for congestion and hearing loss.   Eyes: Negative for blurred vision and double vision.  Respiratory: Negative for cough and shortness of breath.   Cardiovascular: Negative for chest pain and palpitations.  Gastrointestinal: Negative for abdominal pain, blood in stool, constipation, diarrhea, heartburn, melena and vomiting.  Genitourinary: Negative for dysuria and urgency.  Musculoskeletal: Negative for joint pain and myalgias.  Skin: Negative for itching and rash.  Neurological: Negative for dizziness and headaches.  Psychiatric/Behavioral: Negative for depression. The patient is not nervous/anxious.        Objective: BP (!) 146/88   Pulse (!) 57   Temp 97.7 F (36.5 C) (Temporal)   Ht 5\' 5"  (1.651 m)   Wt 165 lb (74.8 kg)   BMI 27.46 kg/m  Physical Exam Constitutional:      Appearance: Normal appearance.  HENT:  Head: Normocephalic and atraumatic.  Eyes:     Extraocular Movements: Extraocular movements intact.     Conjunctiva/sclera: Conjunctivae normal.  Cardiovascular:     Rate and Rhythm: Normal rate and regular rhythm.  Pulmonary:     Effort: Pulmonary effort is normal.     Breath sounds: Normal breath sounds.  Abdominal:     General: Bowel sounds are normal.     Palpations: Abdomen is soft.  Musculoskeletal:        General: Normal range of motion.     Cervical back: Normal range of motion and neck supple.  Skin:    General: Skin is warm.  Neurological:     General: No focal deficit present.     Mental Status: He is alert and  oriented to person, place, and time.  Psychiatric:        Mood and Affect: Mood normal.        Behavior: Behavior normal.      Assessment: *Positive Cologuard testing  Plan: Will schedule for  diagnostic colonoscopy.The risks including infection, bleed, or perforation as well as benefits, limitations, alternatives and imponderables have been reviewed with the patient. Questions have been answered. All parties agreeable.  Thank you Dr. Raoul Pitch for the kind referral.    05/30/2020 11:25 AM   Disclaimer: This note was dictated with voice recognition software. Similar sounding words can inadvertently be transcribed and may not be corrected upon review.

## 2020-05-30 NOTE — Patient Instructions (Signed)
We will schedule you for colonoscopy to further evaluate your positive Cologuard testing.  Depending on findings will determine next steps.  Further recommendations to follow.  At [facility] we value your feedback. You may receive a survey about your visit today. Please share your experience as we strive to create trusting relationships with our patients to provide genuine, compassionate, quality care.  We appreciate your understanding and patience as we review any laboratory studies, imaging, and other diagnostic tests that are ordered as we care for you. Our office policy is 5 business days for review of these results, and any emergent or urgent results are addressed in a timely manner for your best interest. If you do not hear from our office in 1 week, please contact us.   We also encourage the use of MyChart, which contains your medical information for your review as well. If you are not enrolled in this feature, an access code is on this after visit summary for your convenience. Thank you for allowing [place] to be involved in your care.  It was great to see you today!  I hope you have a great rest of your spring!!    Elon Alas. Abbey Chatters, D.O. Gastroenterology and Hepatology [facility]

## 2020-05-31 MED ORDER — PEG 3350-KCL-NA BICARB-NACL 420 G PO SOLR: ORAL | 0 refills | Status: DC

## 2020-05-31 NOTE — Telephone Encounter (Signed)
Patient returned call. He has been scheduled for 6/6 at 12:30pm. Aware will need covid test prior and will mail this appt with his instructions. Confirmed pharmacy. Confirmed address. Aware to pick prep up now and just leave out on shelf. Also made aware to call if he does not receive instructions. He voiced understanding.   PA approved via AIM for TCS Order ID: [id number]     Approval Valid Through: 05/31/2020 - 07/29/2020

## 2020-06-28 ENCOUNTER — Other Ambulatory Visit (HOSPITAL_COMMUNITY): Admission: RE | Admit: 2020-06-28 | Payer: BC Managed Care – PPO | Source: Ambulatory Visit

## 2020-07-01 ENCOUNTER — Ambulatory Visit (HOSPITAL_COMMUNITY): Payer: BC Managed Care – PPO | Admitting: Anesthesiology

## 2020-07-01 ENCOUNTER — Ambulatory Visit (HOSPITAL_COMMUNITY)
Admission: RE | Admit: 2020-07-01 | Discharge: 2020-07-01 | Disposition: A | Discharge status: Home / Self Care | Payer: BC Managed Care – PPO | Attending: Internal Medicine | Admitting: Internal Medicine

## 2020-07-01 ENCOUNTER — Encounter (HOSPITAL_COMMUNITY)
Admission: RE | Disposition: A | Discharge status: Home / Self Care | Payer: Self-pay | Source: Home / Self Care | Attending: Internal Medicine

## 2020-07-01 ENCOUNTER — Other Ambulatory Visit: Payer: Self-pay

## 2020-07-01 ENCOUNTER — Encounter (HOSPITAL_COMMUNITY): Payer: Self-pay

## 2020-07-01 DIAGNOSIS — D122 Benign neoplasm of ascending colon: Secondary | ICD-10-CM | POA: Diagnosis not present

## 2020-07-01 DIAGNOSIS — K648 Other hemorrhoids: Secondary | ICD-10-CM | POA: Insufficient documentation

## 2020-07-01 DIAGNOSIS — K5730 Diverticulosis of large intestine without perforation or abscess without bleeding: Secondary | ICD-10-CM | POA: Diagnosis not present

## 2020-07-01 DIAGNOSIS — K635 Polyp of colon: Secondary | ICD-10-CM

## 2020-07-01 DIAGNOSIS — K6389 Other specified diseases of intestine: Secondary | ICD-10-CM | POA: Insufficient documentation

## 2020-07-01 DIAGNOSIS — Z79899 Other long term (current) drug therapy: Secondary | ICD-10-CM | POA: Insufficient documentation

## 2020-07-01 DIAGNOSIS — D123 Benign neoplasm of transverse colon: Secondary | ICD-10-CM | POA: Insufficient documentation

## 2020-07-01 DIAGNOSIS — R195 Other fecal abnormalities: Secondary | ICD-10-CM | POA: Diagnosis not present

## 2020-07-01 HISTORY — PX: POLYPECTOMY: SHX5525

## 2020-07-01 HISTORY — PX: BIOPSY: SHX5522

## 2020-07-01 HISTORY — PX: COLONOSCOPY WITH PROPOFOL: SHX5780

## 2020-07-01 SURGERY — COLONOSCOPY WITH PROPOFOL
Anesthesia: General

## 2020-07-01 MED ORDER — LACTATED RINGERS IV SOLN: INTRAVENOUS | Status: DC

## 2020-07-01 MED ORDER — PROPOFOL 10 MG/ML IV BOLUS
INTRAVENOUS | Status: DC | PRN
  Administered 2020-07-01: 100 mg via INTRAVENOUS
  Administered 2020-07-01: 125 ug/kg/min via INTRAVENOUS

## 2020-07-01 MED ORDER — STERILE WATER FOR IRRIGATION IR SOLN
Status: DC | PRN
  Administered 2020-07-01: 1.5 mL

## 2020-07-01 MED ORDER — PROPOFOL 10 MG/ML IV BOLUS
INTRAVENOUS | Status: DC | PRN
  Administered 2020-07-01: 40 mg via INTRAVENOUS
  Administered 2020-07-01: 50 mg via INTRAVENOUS
  Administered 2020-07-01 (×2): 30 mg via INTRAVENOUS

## 2020-07-01 NOTE — Op Note (Signed)
[facility] Patient Name: John Morrison Procedure Date: 07/01/2020 11:50 AM MRN: [id number] Date of Birth: 1960-06-18 Attending MD: Elon Alas. Abbey Chatters DO CSN: [id number] Age: 60 Admit Type: Outpatient Procedure:                Colonoscopy Indications:              Positive Cologuard test Providers:                Elon Alas. Abbey Chatters, DO, Gwynneth Albright RN, RN,                            Aram Candela Referring MD:              Medicines:                See the Anesthesia note for documentation of the                            administered medications Complications:            No immediate complications. Estimated Blood Loss:     Estimated blood loss was minimal. Procedure:                Pre-Anesthesia Assessment:                           - The anesthesia plan was to use monitored                            anesthesia care (MAC).                           After obtaining informed consent, the colonoscope                            was passed under direct vision. Throughout the                            procedure, the patient's blood pressure, pulse, and                            oxygen saturations were monitored continuously. The                            [id number]-[id number] ([id number]) scope was introduced through                            the anus and advanced to the the cecum, identified                            by appendiceal orifice and ileocecal valve. The                            colonoscopy was performed without difficulty. The                            patient tolerated the procedure well. The quality  of the bowel preparation was evaluated using the                            BBPS [facility] Bowel Preparation Scale) with scores                            of: Right Colon = 3, Transverse Colon = 3 and Left                            Colon = 3 (entire mucosa seen well with no residual                            staining, small fragments of stool or  opaque                            liquid). The total BBPS score equals 9. Scope In: 11:58:50 AM Scope Out: 62:56:38 PM Scope Withdrawal Time: 0 hours 10 minutes 43 seconds  Total Procedure Duration: 0 hours 17 minutes 19 seconds  Findings:      The perianal and digital rectal examinations were normal.      Non-bleeding internal hemorrhoids were found during endoscopy.      Multiple small and large-mouthed diverticula were found in the sigmoid       colon.      A 2 mm polyp was found in the ascending colon. The polyp was sessile.       The polyp was removed with a cold biopsy forceps. Resection and       retrieval were complete.      A 8 mm polyp was found in the ascending colon. The polyp was sessile.       The polyp was removed with a cold snare. Resection and retrieval were       complete.      A 5 mm polyp was found in the transverse colon. The polyp was sessile.       The polyp was removed with a cold snare. Resection and retrieval were       complete.      A localized area of nodular mucosa was found in the sigmoid colon.       Biopsies were taken with a cold forceps for histology. Impression:               - Non-bleeding internal hemorrhoids.                           - Diverticulosis in the sigmoid colon.                           - One 2 mm polyp in the ascending colon, removed                            with a cold biopsy forceps. Resected and retrieved.                           - One 8 mm polyp in the ascending colon, removed  with a cold snare. Resected and retrieved.                           - One 5 mm polyp in the transverse colon, removed                            with a cold snare. Resected and retrieved.                           - Nodular mucosa in the sigmoid colon. Biopsied. Moderate Sedation:      Per Anesthesia Care Recommendation:           - Patient has a contact number available for                            emergencies. The  signs and symptoms of potential                            delayed complications were discussed with the                            patient. Return to normal activities tomorrow.                            Written discharge instructions were provided to the                            patient.                           - Resume previous diet.                           - Continue present medications.                           - Await pathology results.                           - Repeat colonoscopy in 5 years for surveillance.                           - Return to GI clinic PRN. Procedure Code(s):        --- Professional ---                           [id number], Colonoscopy, flexible; with removal of                            tumor(s), polyp(s), or other lesion(s) by snare                            technique                           75916, 14, Colonoscopy, flexible; with biopsy,  single or multiple Diagnosis Code(s):        --- Professional ---                           K64.8, Other hemorrhoids                           K63.5, Polyp of colon                           K63.89, Other specified diseases of intestine                           R19.5, Other fecal abnormalities                           K57.30, Diverticulosis of large intestine without                            perforation or abscess without bleeding CPT copyright 2019 American Medical Association. All rights reserved. The codes documented in this report are preliminary and upon coder review may  be revised to meet current compliance requirements. Elon Alas. Abbey Chatters, DO [place] Abbey Chatters, DO 07/01/2020 12:21:24 PM This report has been signed electronically. Number of Addenda: 0

## 2020-07-01 NOTE — Transfer of Care (Signed)
Immediate Anesthesia Transfer of Care Note  Patient: John Morrison  Procedure(s) Performed: COLONOSCOPY WITH PROPOFOL (N/A ) POLYPECTOMY BIOPSY  Patient Location: Endoscopy Unit  Anesthesia Type:General  Level of Consciousness: drowsy  Airway & Oxygen Therapy: Patient Spontanous Breathing  Post-op Assessment: Report given to RN and Post -op Vital signs reviewed and stable  Post vital signs: Reviewed and stable  Last Vitals:  Vitals Value Taken Time  BP 84/54 07/01/20 1219  Temp 36.8 C 07/01/20 1219  Pulse    Resp 13 07/01/20 1219  SpO2 99 % 07/01/20 1219    Last Pain:  Vitals:   07/01/20 1219  TempSrc: Oral  PainSc: 0-No pain      Patients Stated Pain Goal: 9 (16/10/96 0454)  Complications: No complications documented.

## 2020-07-01 NOTE — Anesthesia Procedure Notes (Signed)
Date/Time: 07/01/2020 12:02 PM Performed by: Orlie Dakin, CRNA Pre-anesthesia Checklist: Patient identified, Emergency Drugs available, Suction available and Patient being monitored Oxygen Delivery Method: Nasal cannula Induction Type: IV induction Placement Confirmation: positive ETCO2

## 2020-07-01 NOTE — Discharge Instructions (Addendum)
Colonoscopy Discharge Instructions  Read the instructions outlined below and refer to this sheet in the next few weeks. These discharge instructions provide you with general information on caring for yourself after you leave the hospital. Your doctor may also give you specific instructions. While your treatment has been planned according to the most current medical practices available, unavoidable complications occasionally occur.   ACTIVITY  You may resume your regular activity, but move at a slower pace for the next 24 hours.   Take frequent rest periods for the next 24 hours.   Walking will help get rid of the air and reduce the bloated feeling in your belly (abdomen).   No driving for 24 hours (because of the medicine (anesthesia) used during the test).    Do not sign any important legal documents or operate any machinery for 24 hours (because of the anesthesia used during the test).  NUTRITION  Drink plenty of fluids.   You may resume your normal diet as instructed by your doctor.   Begin with a light meal and progress to your normal diet. Heavy or fried foods are harder to digest and may make you feel sick to your stomach (nauseated).   Avoid alcoholic beverages for 24 hours or as instructed.  MEDICATIONS  You may resume your normal medications unless your doctor tells you otherwise.  WHAT YOU CAN EXPECT TODAY  Some feelings of bloating in the abdomen.   Passage of more gas than usual.   Spotting of blood in your stool or on the toilet paper.  IF YOU HAD POLYPS REMOVED DURING THE COLONOSCOPY:  No aspirin products for 7 days or as instructed.   No alcohol for 7 days or as instructed.   Eat a soft diet for the next 24 hours.  FINDING OUT THE RESULTS OF YOUR TEST Not all test results are available during your visit. If your test results are not back during the visit, make an appointment with your caregiver to find out the results. Do not assume everything is normal if  you have not heard from your caregiver or the medical facility. It is important for you to follow up on all of your test results.  SEEK IMMEDIATE MEDICAL ATTENTION IF:  You have more than a spotting of blood in your stool.   Your belly is swollen (abdominal distention).   You are nauseated or vomiting.   You have a temperature over 101.   You have abdominal pain or discomfort that is severe or gets worse throughout the day.   Your colonoscopy revealed 3 polyp(s) which I removed successfully. Await pathology results, my office will contact you. I recommend repeating colonoscopy in 5 years for surveillance purposes. Therer was an area of nodular mucosa in your colon that I also biopsied, this is likely benign. You also have diverticulosis and internal hemorrhoids. I would recommend increasing fiber in your diet or adding OTC Benefiber/Metamucil. Be sure to drink at least 4 to 6 glasses of water daily. Follow-up with GI as needed.    I hope you have a great rest of your week!  Elon Alas. Abbey Chatters, D.O. Gastroenterology and Hepatology [facility]   Hemorrhoids Hemorrhoids are swollen veins in and around the rectum or anus. There are two types of hemorrhoids:  Internal hemorrhoids. These occur in the veins that are just inside the rectum. They may poke through to the outside and become irritated and painful.  External hemorrhoids. These occur in the veins that are outside  the anus and can be felt as a painful swelling or hard lump near the anus. Most hemorrhoids do not cause serious problems, and they can be managed with home treatments such as diet and lifestyle changes. If home treatments do not help the symptoms, procedures can be done to shrink or remove the hemorrhoids. What are the causes? This condition is caused by increased pressure in the anal area. This pressure may result from various things, including:  Constipation.  Straining to have a bowel  movement.  Diarrhea.  Pregnancy.  Obesity.  Sitting for long periods of time.  Heavy lifting or other activity that causes you to strain.  Anal sex.  Riding a bike for a long period of time. What are the signs or symptoms? Symptoms of this condition include:  Pain.  Anal itching or irritation.  Rectal bleeding.  Leakage of stool (feces).  Anal swelling.  One or more lumps around the anus. How is this diagnosed? This condition can often be diagnosed through a visual exam. Other exams or tests may also be done, such as:  An exam that involves feeling the rectal area with a gloved hand (digital rectal exam).  An exam of the anal canal that is done using a small tube (anoscope).  A blood test, if you have lost a significant amount of blood.  A test to look inside the colon using a flexible tube with a camera on the end (sigmoidoscopy or colonoscopy). How is this treated? This condition can usually be treated at home. However, various procedures may be done if dietary changes, lifestyle changes, and other home treatments do not help your symptoms. These procedures can help make the hemorrhoids smaller or remove them completely. Some of these procedures involve surgery, and others do not. Common procedures include:  Rubber band ligation. Rubber bands are placed at the base of the hemorrhoids to cut off their blood supply.  Sclerotherapy. Medicine is injected into the hemorrhoids to shrink them.  Infrared coagulation. A type of light energy is used to get rid of the hemorrhoids.  Hemorrhoidectomy surgery. The hemorrhoids are surgically removed, and the veins that supply them are tied off.  Stapled hemorrhoidopexy surgery. The surgeon staples the base of the hemorrhoid to the rectal wall. Follow these instructions at home: Eating and drinking  Eat foods that have a lot of fiber in them, such as whole grains, beans, nuts, fruits, and vegetables.  Ask your health care  provider about taking products that have added fiber (fiber supplements).  Reduce the amount of fat in your diet. You can do this by eating low-fat dairy products, eating less red meat, and avoiding processed foods.  Drink enough fluid to keep your urine pale yellow.   Managing pain and swelling  Take warm sitz baths for 20 minutes, 3-4 times a day to ease pain and discomfort. You may do this in a bathtub or using a portable sitz bath that fits over the toilet.  If directed, apply ice to the affected area. Using ice packs between sitz baths may be helpful. ? Put ice in a plastic bag. ? Place a towel between your skin and the bag. ? Leave the ice on for 20 minutes, 2-3 times a day.   General instructions  Take over-the-counter and prescription medicines only as told by your health care provider.  Use medicated creams or suppositories as told.  Get regular exercise. Ask your health care provider how much and what kind of exercise is best  for you. In general, you should do moderate exercise for at least 30 minutes on most days of the week (150 minutes each week). This can include activities such as walking, biking, or yoga.  Go to the bathroom when you have the urge to have a bowel movement. Do not wait.  Avoid straining to have bowel movements.  Keep the anal area dry and clean. Use wet toilet paper or moist towelettes after a bowel movement.  Do not sit on the toilet for long periods of time. This increases blood pooling and pain.  Keep all follow-up visits as told by your health care provider. This is important. Contact a health care provider if you have:  Increasing pain and swelling that are not controlled by treatment or medicine.  Difficulty having a bowel movement, or you are unable to have a bowel movement.  Pain or inflammation outside the area of the hemorrhoids. Get help right away if you have:  Uncontrolled bleeding from your rectum. Summary  Hemorrhoids are  swollen veins in and around the rectum or anus.  Most hemorrhoids can be managed with home treatments such as diet and lifestyle changes.  Taking warm sitz baths can help ease pain and discomfort.  In severe cases, procedures or surgery can be done to shrink or remove the hemorrhoids. This information is not intended to replace advice given to you by your health care provider. Make sure you discuss any questions you have with your health care provider. Document Revised: 06/10/2018 Document Reviewed: 06/03/2017 Elsevier Patient Education  2021 [employer].  Diverticulosis  Diverticulosis is a condition that develops when small pouches (diverticula) form in the wall of the large intestine (colon). The colon is where water is absorbed and stool (feces) is formed. The pouches form when the inside layer of the colon pushes through weak spots in the outer layers of the colon. You may have a few pouches or many of them. The pouches usually do not cause problems unless they become inflamed or infected. When this happens, the condition is called diverticulitis. What are the causes? The cause of this condition is not known. What increases the risk? The following factors may make you more likely to develop this condition:  Being older than age 40. Your risk for this condition increases with age. Diverticulosis is rare among people younger than age 64. By age 54, many people have it.  Eating a low-fiber diet.  Having frequent constipation.  Being overweight.  Not getting enough exercise.  Smoking.  Taking over-the-counter pain medicines, like aspirin and ibuprofen.  Having a family history of diverticulosis. What are the signs or symptoms? In most people, there are no symptoms of this condition. If you do have symptoms, they may include:  Bloating.  Cramps in the abdomen.  Constipation or diarrhea.  Pain in the lower left side of the abdomen. How is this diagnosed? Because  diverticulosis usually has no symptoms, it is most often diagnosed during an exam for other colon problems. The condition may be diagnosed by:  Using a flexible scope to examine the colon (colonoscopy).  Taking an X-ray of the colon after dye has been put into the colon (barium enema).  Having a CT scan. How is this treated? You may not need treatment for this condition. Your health care provider may recommend treatment to prevent problems. You may need treatment if you have symptoms or if you previously had diverticulitis. Treatment may include:  Eating a high-fiber diet.  Taking a  fiber supplement.  Taking a live bacteria supplement (probiotic).  Taking medicine to relax your colon.   Follow these instructions at home: Medicines  Take over-the-counter and prescription medicines only as told by your health care provider.  If told by your health care provider, take a fiber supplement or probiotic. Constipation prevention Your condition may cause constipation. To prevent or treat constipation, you may need to:  Drink enough fluid to keep your urine pale yellow.  Take over-the-counter or prescription medicines.  Eat foods that are high in fiber, such as beans, whole grains, and fresh fruits and vegetables.  Limit foods that are high in fat and processed sugars, such as fried or sweet foods.   General instructions  Try not to strain when you have a bowel movement.  Keep all follow-up visits as told by your health care provider. This is important. Contact a health care provider if you:  Have pain in your abdomen.  Have bloating.  Have cramps.  Have not had a bowel movement in 3 days. Get help right away if:  Your pain gets worse.  Your bloating becomes very bad.  You have a fever or chills, and your symptoms suddenly get worse.  You vomit.  You have bowel movements that are bloody or black.  You have bleeding from your rectum. Summary  Diverticulosis is a  condition that develops when small pouches (diverticula) form in the wall of the large intestine (colon).  You may have a few pouches or many of them.  This condition is most often diagnosed during an exam for other colon problems.  Treatment may include increasing the fiber in your diet, taking supplements, or taking medicines. This information is not intended to replace advice given to you by your health care provider. Make sure you discuss any questions you have with your health care provider. Document Revised: 08/11/2018 Document Reviewed: 08/11/2018 Elsevier Patient Education  [employer].  Colon Polyps  Colon polyps are tissue growths inside the colon, which is part of the large intestine. They are one of the types of polyps that can grow in the body. A polyp may be a round bump or a mushroom-shaped growth. You could have one polyp or more than one. Most colon polyps are noncancerous (benign). However, some colon polyps can become cancerous over time. Finding and removing the polyps early can help prevent this. What are the causes? The exact cause of colon polyps is not known. What increases the risk? The following factors may make you more likely to develop this condition:  Having a family history of colorectal cancer or colon polyps.  Being older than 58 years of age.  Being younger than 58 years of age and having a significant family history of colorectal cancer or colon polyps or a genetic condition that puts you at higher risk of getting colon polyps.  Having inflammatory bowel disease, such as ulcerative colitis or Crohn's disease.  Having certain conditions passed from parent to child (hereditary conditions), such as: ? Familial adenomatous polyposis (FAP). ? Lynch syndrome. ? Turcot syndrome. ? Peutz-Jeghers syndrome. ? MUTYH-associated polyposis (MAP).  Being overweight.  Certain lifestyle factors. These include smoking cigarettes, drinking too much alcohol,  not getting enough exercise, and eating a diet that is high in fat and red meat and low in fiber.  Having had childhood cancer that was treated with radiation of the abdomen. What are the signs or symptoms? Many times, there are no symptoms. If you have symptoms, they  may include:  Blood coming from the rectum during a bowel movement.  Blood in the stool (feces). The blood may be bright red or very dark in color.  Pain in the abdomen.  A change in bowel habits, such as constipation or diarrhea. How is this diagnosed? This condition is diagnosed with a colonoscopy. This is a procedure in which a lighted, flexible scope is inserted into the opening between the buttocks (anus) and then passed into the colon to examine the area. Polyps are sometimes found when a colonoscopy is done as part of routine cancer screening tests. How is this treated? This condition is treated by removing any polyps that are found. Most polyps can be removed during a colonoscopy. Those polyps will then be tested for cancer. Additional treatment may be needed depending on the results of testing. Follow these instructions at home: Eating and drinking  Eat foods that are high in fiber, such as fruits, vegetables, and whole grains.  Eat foods that are high in calcium and vitamin D, such as milk, cheese, yogurt, eggs, liver, fish, and broccoli.  Limit foods that are high in fat, such as fried foods and desserts.  Limit the amount of red meat, precooked or cured meat, or other processed meat that you eat, such as hot dogs, sausages, bacon, or meat loaves.  Limit sugary drinks.   Lifestyle  Maintain a healthy weight, or lose weight if recommended by your health care provider.  Exercise every day or as told by your health care provider.  Do not use any products that contain nicotine or tobacco, such as cigarettes, e-cigarettes, and chewing tobacco. If you need help quitting, ask your health care provider.  Do not  drink alcohol if: ? Your health care provider tells you not to drink. ? You are pregnant, may be pregnant, or are planning to become pregnant.  If you drink alcohol: ? Limit how much you use to:  0-1 drink a day for women.  0-2 drinks a day for men. ? Know how much alcohol is in your drink. In the [place] one drink equals one 12 oz bottle of beer (355 mL), one 5 oz glass of wine (148 mL), or one 1 oz glass of hard liquor (44 mL). General instructions  Take over-the-counter and prescription medicines only as told by your health care provider.  Keep all follow-up visits. This is important. This includes having regularly scheduled colonoscopies. Talk to your health care provider about when you need a colonoscopy. Contact a health care provider if:  You have new or worsening bleeding during a bowel movement.  You have new or increased blood in your stool.  You have a change in bowel habits.  You lose weight for no known reason. Summary  Colon polyps are tissue growths inside the colon, which is part of the large intestine. They are one type of polyp that can grow in the body.  Most colon polyps are noncancerous (benign), but some can become cancerous over time.  This condition is diagnosed with a colonoscopy.  This condition is treated by removing any polyps that are found. Most polyps can be removed during a colonoscopy. This information is not intended to replace advice given to you by your health care provider. Make sure you discuss any questions you have with your health care provider. Document Revised: 05/03/2019 Document Reviewed: 05/03/2019 Elsevier Patient Education  2021 [employer].

## 2020-07-01 NOTE — H&P (Signed)
Primary Care Physician:  Ma Hillock, DO Primary Gastroenterologist:  Dr. Abbey Chatters  Pre-Procedure History & Physical: HPI:  John Morrison is a 60 y.o. male is here  for a colonoscopy to be performed for positive cologuard testing.  Patient denies any family history of colorectal cancer.  No melena or hematochezia.  No abdominal pain or unintentional weight loss.  No change in bowel habits.  Overall feels well from a GI standpoint.  Past Medical History:  Diagnosis Date  . CKD (chronic kidney disease)   . Hypertension   . Kidney stone     Past Surgical History:  Procedure Laterality Date  . NO PAST SURGERIES      Prior to Admission medications   Medication Sig Start Date End Date Taking? Authorizing Provider  lisinopril (ZESTRIL) 40 MG tablet Take 1 tablet (40 mg total) by mouth daily. 04/18/20  Yes Kuneff, Renee A, DO  polyethylene glycol-electrolytes (NULYTELY) 420 g solution As directed 05/31/20  Yes Eloise Harman, DO    Allergies as of 05/31/2020  . (No Known Allergies)    Family History  Adopted: Yes  Problem Relation Age of Onset  . Colon cancer Neg Hx     Social History   Socioeconomic History  . Marital status: Married    Spouse name: Not on file  . Number of children: Not on file  . Years of education: Not on file  . Highest education level: Not on file  Occupational History  . Not on file  Tobacco Use  . Smoking status: Never Smoker  . Smokeless tobacco: Current User    Types: Chew, Snuff  Vaping Use  . Vaping Use: Never used  Substance and Sexual Activity  . Alcohol use: Not Currently  . Drug use: No  . Sexual activity: Not Currently    Partners: Female  Other Topics Concern  . Not on file  Social History Narrative   Marital status/children/pets: Married   Education/employment: High school education, works as a Glass blower/designer.   Safety:      -smoke alarm in the home:Yes     - wears seatbelt: Yes     - Feels safe in their relationships: Yes    Social Determinants of Health   Financial Resource Strain: Not on file  Food Insecurity: Not on file  Transportation Needs: Not on file  Physical Activity: Not on file  Stress: Not on file  Social Connections: Not on file  Intimate Partner Violence: Not on file    Review of Systems: See HPI, otherwise negative ROS  Physical Exam: Vital signs in last 24 hours: Temp:  [98.2 F (36.8 C)] 98.2 F (36.8 C) (06/06 1120) Pulse Rate:  [61] 61 (06/06 1120) Resp:  [16] 16 (06/06 1120) BP: (142)/(77) 142/77 (06/06 1120) SpO2:  [100 %] 100 % (06/06 1120) Weight:  [74.4 kg] 74.4 kg (06/06 1120)   General:   Alert,  Well-developed, well-nourished, pleasant and cooperative in NAD Head:  Normocephalic and atraumatic. Eyes:  Sclera clear, no icterus.   Conjunctiva pink. Ears:  Normal auditory acuity. Nose:  No deformity, discharge,  or lesions. Mouth:  No deformity or lesions, dentition normal. Neck:  Supple; no masses or thyromegaly. Lungs:  Clear throughout to auscultation.   No wheezes, crackles, or rhonchi. No acute distress. Heart:  Regular rate and rhythm; no murmurs, clicks, rubs,  or gallops. Abdomen:  Soft, nontender and nondistended. No masses, hepatosplenomegaly or hernias noted. Normal bowel sounds, without guarding, and without  rebound.   Msk:  Symmetrical without gross deformities. Normal posture. Extremities:  Without clubbing or edema. Neurologic:  Alert and  oriented x4;  grossly normal neurologically. Skin:  Intact without significant lesions or rashes. Cervical Nodes:  No significant cervical adenopathy. Psych:  Alert and cooperative. Normal mood and affect.  Impression/Plan: John Morrison is here for a colonoscopy to be performed for positive cologuard testing  The risks of the procedure including infection, bleed, or perforation as well as benefits, limitations, alternatives and imponderables have been reviewed with the patient. Questions have been answered. All  parties agreeable.

## 2020-07-01 NOTE — Anesthesia Postprocedure Evaluation (Signed)
Anesthesia Post Note  Patient: John Morrison  Procedure(s) Performed: COLONOSCOPY WITH PROPOFOL (N/A ) POLYPECTOMY BIOPSY  Patient location during evaluation: Endoscopy Anesthesia Type: General Level of consciousness: awake and alert and oriented Pain management: pain level controlled Vital Signs Assessment: post-procedure vital signs reviewed and stable Respiratory status: spontaneous breathing and respiratory function stable Cardiovascular status: blood pressure returned to baseline and stable Postop Assessment: no apparent nausea or vomiting Anesthetic complications: no   No complications documented.   Last Vitals:  Vitals:   07/01/20 1219 07/01/20 1222  BP: (!) 84/54 93/61  Pulse:    Resp: 13   Temp: 36.8 C   SpO2: 99%     Last Pain:  Vitals:   07/01/20 1219  TempSrc: Oral  PainSc: 0-No pain                 Denya Buckingham C Ulah Olmo

## 2020-07-01 NOTE — Anesthesia Preprocedure Evaluation (Addendum)
Anesthesia Evaluation  Patient identified by MRN, date of birth, ID band Patient awake    Reviewed: Allergy & Precautions, NPO status , Patient's Chart, lab work & pertinent test results  Airway Mallampati: II  TM Distance: >3 FB Neck ROM: Full    Dental  (+) Dental Advisory Given, Missing, Chipped   Pulmonary neg pulmonary ROS,    Pulmonary exam normal breath sounds clear to auscultation       Cardiovascular Exercise Tolerance: Good hypertension, Pt. on medications Normal cardiovascular exam Rhythm:Regular Rate:Normal     Neuro/Psych negative neurological ROS  negative psych ROS   GI/Hepatic negative GI ROS, Neg liver ROS,   Endo/Other  negative endocrine ROS  Renal/GU Renal disease     Musculoskeletal  (+) Arthritis  (back pain),   Abdominal   Peds  Hematology negative hematology ROS (+)   Anesthesia Other Findings   Reproductive/Obstetrics negative OB ROS                            Anesthesia Physical Anesthesia Plan  ASA: II  Anesthesia Plan: General   Post-op Pain Management:    Induction: Intravenous  PONV Risk Score and Plan: Propofol infusion  Airway Management Planned: Nasal Cannula and Natural Airway  Additional Equipment:   Intra-op Plan:   Post-operative Plan:   Informed Consent: I have reviewed the patients History and Physical, chart, labs and discussed the procedure including the risks, benefits and alternatives for the proposed anesthesia with the patient or authorized representative who has indicated his/her understanding and acceptance.     Dental advisory given  Plan Discussed with: Surgeon  Anesthesia Plan Comments:         Anesthesia Quick Evaluation

## 2020-07-02 DIAGNOSIS — D122 Benign neoplasm of ascending colon: Secondary | ICD-10-CM | POA: Diagnosis not present

## 2020-07-02 DIAGNOSIS — D123 Benign neoplasm of transverse colon: Secondary | ICD-10-CM | POA: Diagnosis not present

## 2020-07-03 LAB — SURGICAL PATHOLOGY

## 2020-07-09 ENCOUNTER — Encounter (HOSPITAL_COMMUNITY): Payer: Self-pay | Admitting: Internal Medicine

## 2020-07-11 ENCOUNTER — Telehealth: Payer: Self-pay

## 2020-07-11 NOTE — Telephone Encounter (Signed)
Please NIC repeat colonoscopy in 5 years.

## 2020-07-12 ENCOUNTER — Telehealth: Payer: Self-pay

## 2020-07-12 NOTE — Telephone Encounter (Signed)
Pt called office, informed him of TCS results (see pathology result note).

## 2020-09-17 IMAGING — CT CT RENAL STONE PROTOCOL
2 of 4 series · 16 of 46 positions shown, 18 images · non-contrast
Comparison: 10/06/2007

CLINICAL DATA: LEFT flank pain radiating to abdomen beginning this
afternoon suspected kidney stone disease, history kidney stones

EXAM:
CT ABDOMEN AND PELVIS WITHOUT CONTRAST
TECHNIQUE: Multidetector CT imaging of the abdomen and pelvis was performed
following the standard protocol without IV contrast. Sagittal and
coronal MPR images reconstructed from axial data set. Oral contrast
not administered for this indication.

[Series 2: axial st · axial · 0.69mm/px · z∈[-437,-22]mm · 13 of 91 slices shown, 15 images]
[im 4/91  soft-tissue]
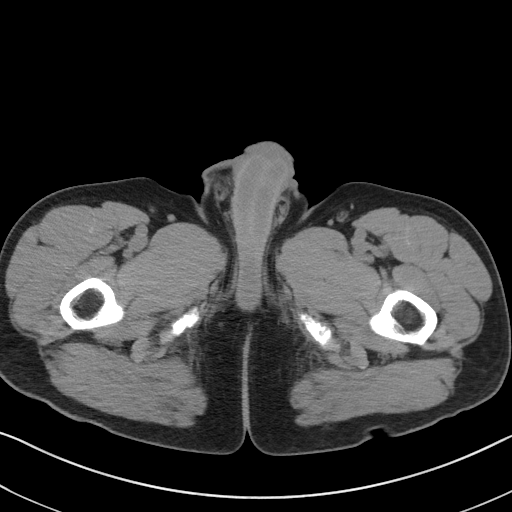
[im 4/91  bone]
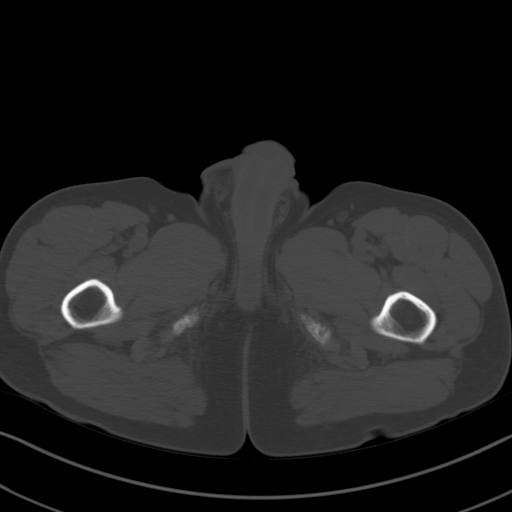
[im 12/91  soft-tissue]
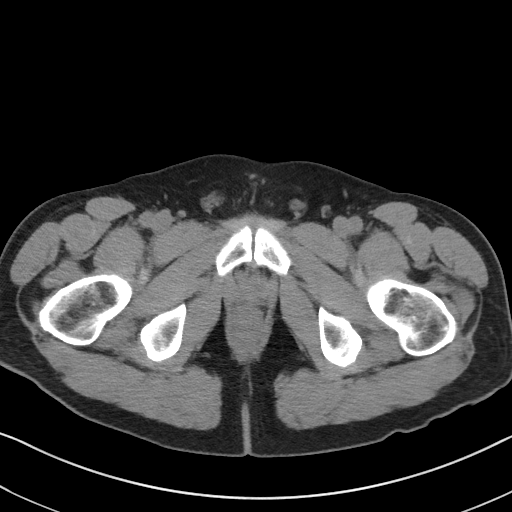
[im 19/91  soft-tissue]
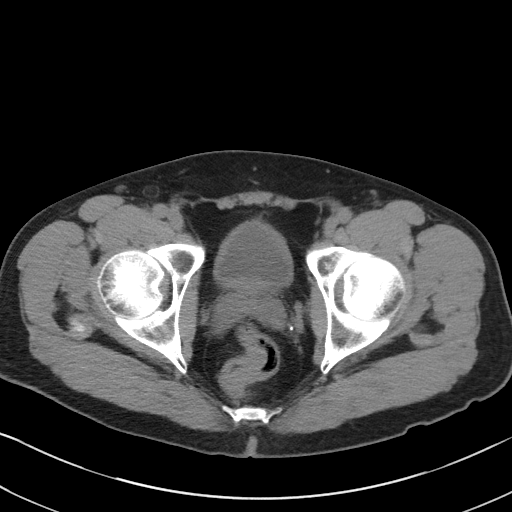
[im 27/91  soft-tissue]
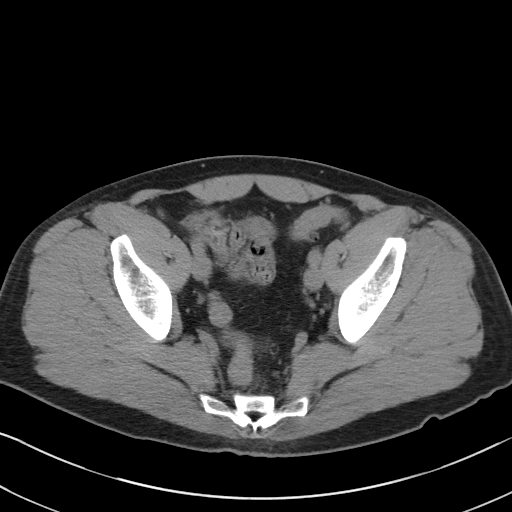
[im 31/91  soft-tissue]
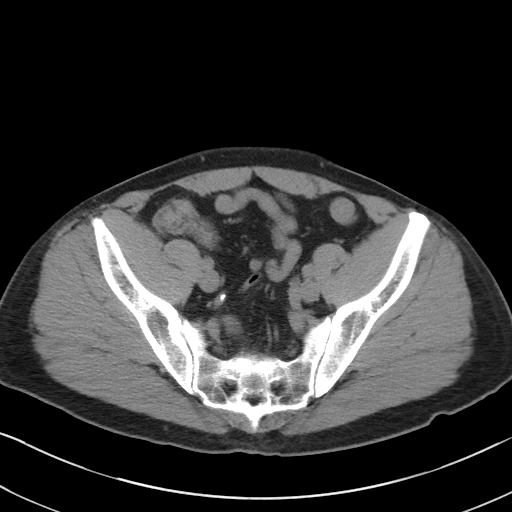
[im 38/91  soft-tissue]
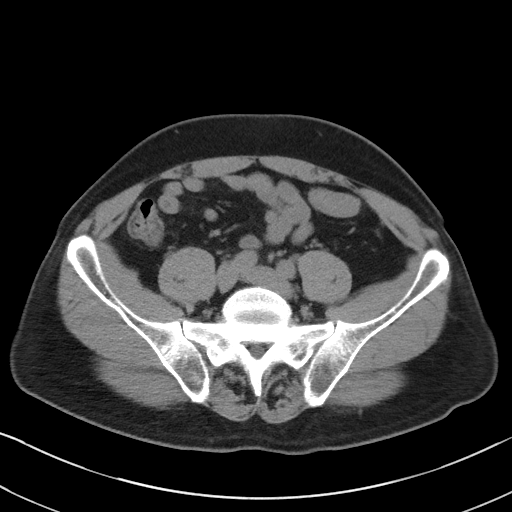
[im 46/91  soft-tissue]
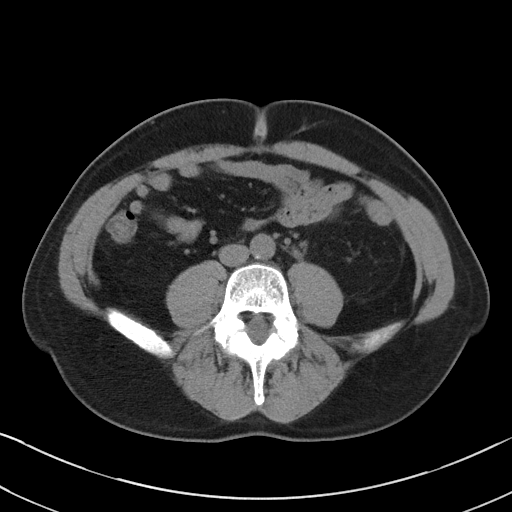
[im 53/91  soft-tissue]
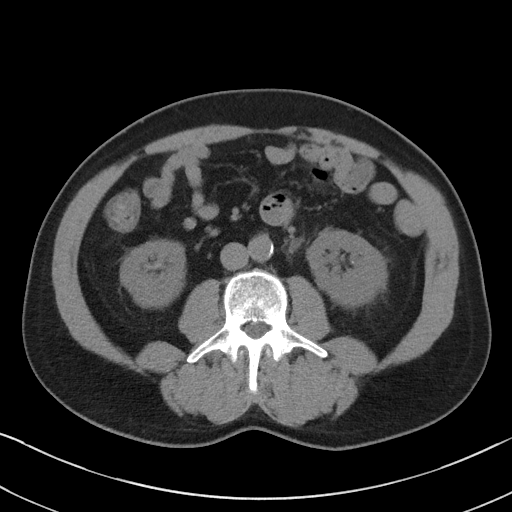
[im 61/91  soft-tissue]
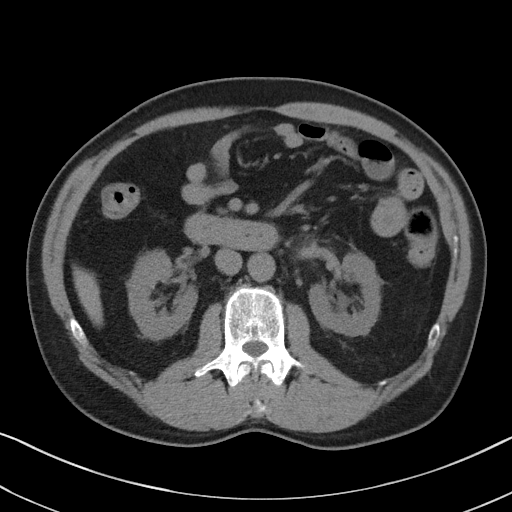
[im 61/91  bone]
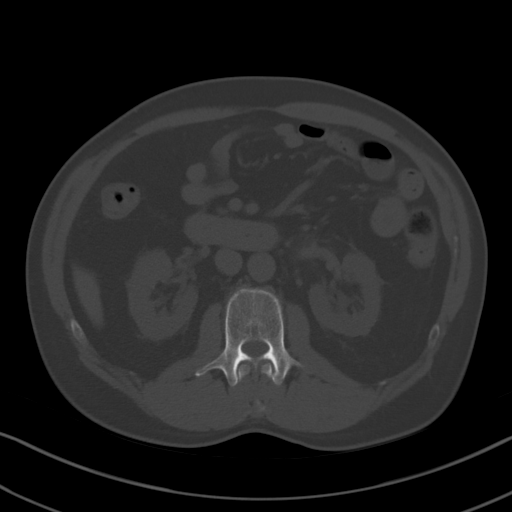
[im 64/91  soft-tissue]
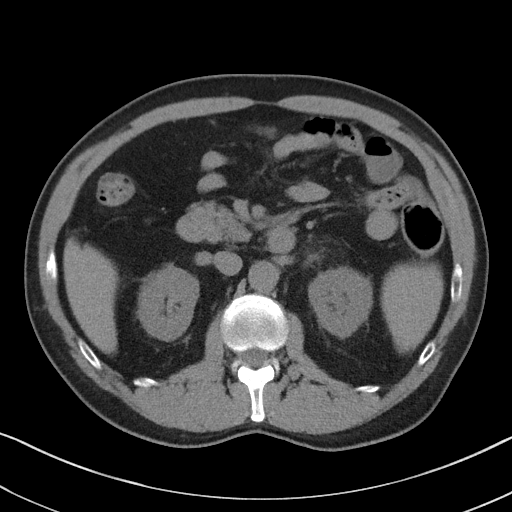
[im 72/91  soft-tissue]
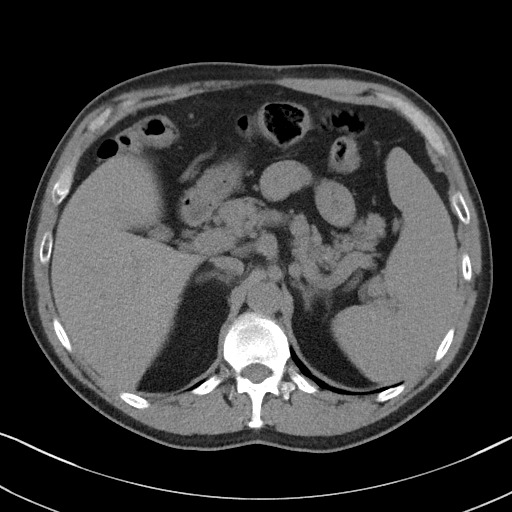
[im 79/91  soft-tissue]
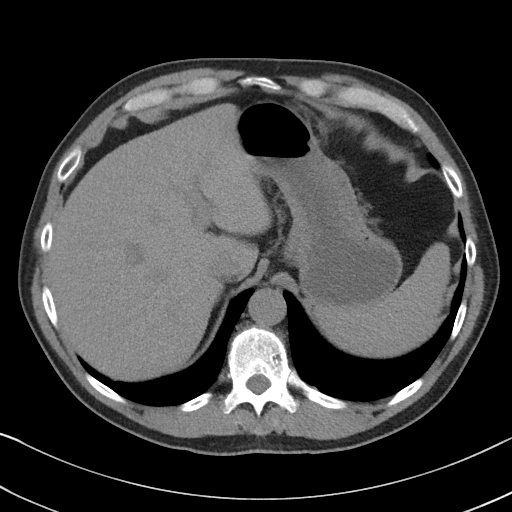
[im 87/91  soft-tissue]
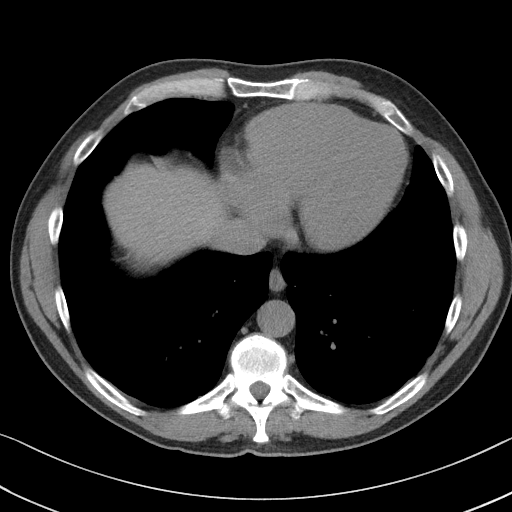

[Series 4: coronal st · coronal · 0.70mm/px · 3 of 97 slices shown]
[im 33/97  soft-tissue]
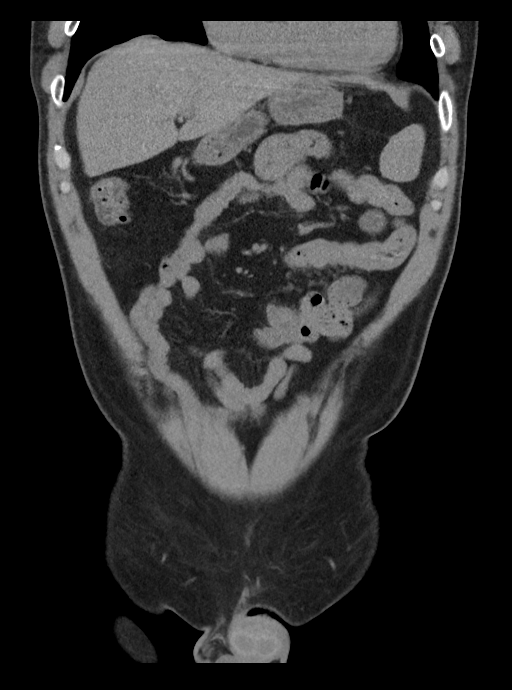
[im 43/97  soft-tissue]
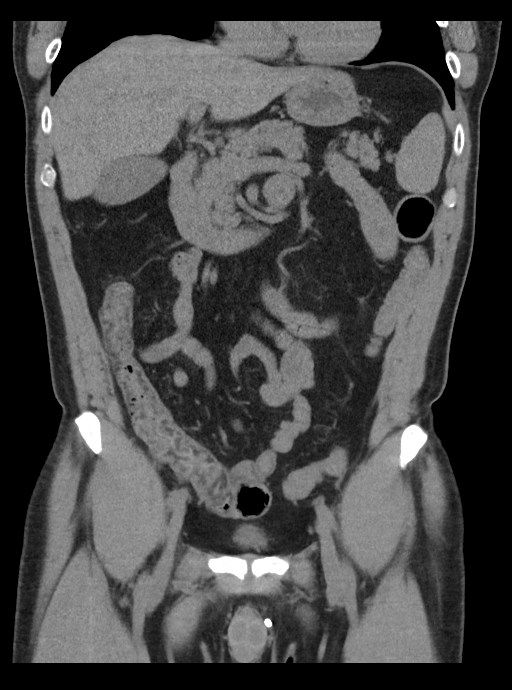
[im 54/97  soft-tissue]
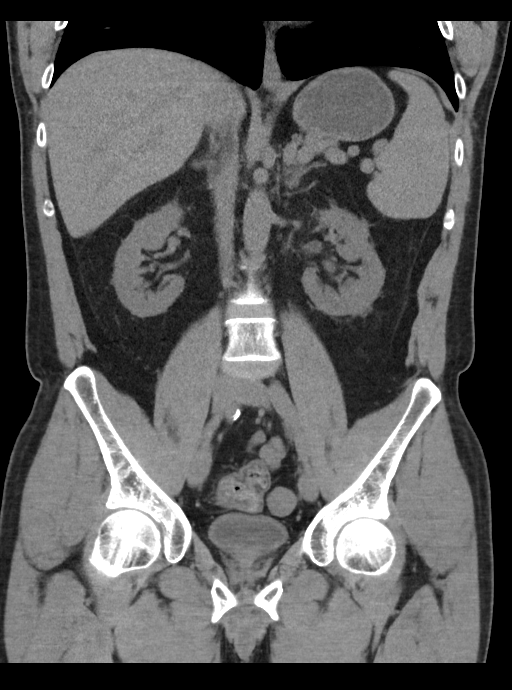

[16 of 46 positions shown; findings below may reference images not displayed]

FINDINGS: Lower chest: Lung bases clear

Hepatobiliary: Gallbladder and liver normal appearance

Pancreas: Normal appearance

Spleen: Normal appearance

Adrenals/Urinary Tract: Adrenal glands normal appearance. LEFT
hydronephrosis and perinephric edema secondary to a 5 x 4 mm
proximal LEFT ureteral calculus. Additional tiny nonobstructing
BILATERAL renal calculi. No RIGHT hydronephrosis. No renal masses.
Remaining ureters decompressed. Bladder unremarkable.

Stomach/Bowel: Normal appendix. Stomach and bowel loops normal
appearance. For technique.

Vascular/Lymphatic: Atherosclerotic calcifications aorta and iliac
arteries without aneurysm. No adenopathy.

Reproductive: Unremarkable prostate gland and seminal vesicles

Other: No free air or free fluid. No hernia or additional
inflammatory process.

Musculoskeletal: BILATERAL spondylolysis L4 with minimal grade 1
anterolisthesis L4-L5. Disc space narrowing L4-L5.
IMPRESSION: LEFT hydronephrosis secondary to a 5 x 4 mm proximal LEFT ureteral
calculus.

Additional tiny BILATERAL nonobstructing renal calculi.

Grade 1 anterolisthesis L4-L5 secondary to BILATERAL spondylolysis
L4.

## 2020-10-14 ENCOUNTER — Ambulatory Visit: Payer: BC Managed Care – PPO | Admitting: Family Medicine

## 2020-10-30 ENCOUNTER — Other Ambulatory Visit: Payer: Self-pay

## 2020-10-30 ENCOUNTER — Ambulatory Visit: Payer: BC Managed Care – PPO | Admitting: Family Medicine

## 2020-10-30 ENCOUNTER — Encounter: Payer: Self-pay | Admitting: Family Medicine

## 2020-10-30 VITALS — BP 125/68 | HR 61 | Temp 98.0°F | Ht 65.0 in | Wt 165.0 lb

## 2020-10-30 DIAGNOSIS — E782 Mixed hyperlipidemia: Secondary | ICD-10-CM

## 2020-10-30 DIAGNOSIS — M545 Low back pain, unspecified: Secondary | ICD-10-CM | POA: Diagnosis not present

## 2020-10-30 DIAGNOSIS — I10 Essential (primary) hypertension: Secondary | ICD-10-CM | POA: Diagnosis not present

## 2020-10-30 DIAGNOSIS — M4316 Spondylolisthesis, lumbar region: Secondary | ICD-10-CM | POA: Diagnosis not present

## 2020-10-30 DIAGNOSIS — G8929 Other chronic pain: Secondary | ICD-10-CM

## 2020-10-30 MED ORDER — LISINOPRIL 40 MG PO TABS: 40.0000 mg | ORAL_TABLET | Freq: Every day | ORAL | 1 refills | Status: DC

## 2020-10-30 NOTE — Progress Notes (Signed)
Patient ID: John Morrison, male  DOB: [birth year], 60 y.o.   MRN: [id number] Patient Care Team    Relationship Specialty Notifications Start End  Ma Hillock, DO PCP - General Family Medicine  03/27/20   Eloise Harman, [place] Physician Internal Medicine  04/24/20     Chief Complaint  Patient presents with   Hypertension    [place]; pt is fasting    Subjective:  John Morrison is a 60 y.o.  male present for Pt reports compliance with lisinopril 40 mg qd. Blood pressures ranges at home not checked. Patient denies chest pain, shortness of breath or lower extremity edema. Labs UTD.   Depression screen [facility] 2/9 10/30/2020 03/27/2020  Decreased Interest 0 0  Down, Depressed, Hopeless 0 0  PHQ - 2 Score 0 0   No flowsheet data found.     No flowsheet data found.  Immunization History  Administered Date(s) Administered   [employer] Vaccination 02/15/2020, 03/15/2020   Tdap 04/01/2017   No results found.  Past Medical History:  Diagnosis Date   CKD (chronic kidney disease)    Hypertension    Kidney stone    No Known Allergies Past Surgical History:  Procedure Laterality Date   BIOPSY  07/01/2020   Procedure: BIOPSY;  Surgeon: Eloise Harman, DO;  [facility];  Service: Endoscopy;;   COLONOSCOPY WITH PROPOFOL N/A 07/01/2020   Procedure: COLONOSCOPY WITH PROPOFOL;  Surgeon: Eloise Harman, DO;  [facility];  Service: Endoscopy;  Laterality: N/A;  12:30pm   NO PAST SURGERIES     POLYPECTOMY  07/01/2020   Procedure: POLYPECTOMY;  Surgeon: Eloise Harman, DO;  [facility];  Service: Endoscopy;;   Family History  Adopted: Yes  Problem Relation Age of Onset   Colon cancer Neg Hx    Social History   Social History Narrative   Marital status/children/pets: Married   Education/employment: High school education, works as a Glass blower/designer.   Safety:      -smoke alarm in the home:Yes     - wears seatbelt: Yes     -  Feels safe in their relationships: Yes    Allergies as of 10/30/2020   No Known Allergies      Medication List        Accurate as of October 30, 2020 11:16 AM. If you have any questions, ask your nurse or doctor.          STOP taking these medications    polyethylene glycol-electrolytes 420 g solution Commonly known as: NuLYTELY Stopped by: Howard Pouch, DO       TAKE these medications    lisinopril 40 MG tablet Commonly known as: ZESTRIL Take 1 tablet (40 mg total) by mouth daily.        All past medical history, surgical history, allergies, family history, immunizations andmedications were updated in the EMR today and reviewed under the history and medication portions of their EMR.    No results found for this or any previous visit (from the past 2160 hour(s)).   ROS: 14 pt review of systems performed and negative (unless mentioned in an HPI)  Objective: BP 125/68   Pulse 61   Temp 98 F (36.7 C) (Oral)   Ht 5\' 5"  (1.651 m)   Wt 165 lb (74.8 kg)   SpO2 100%   BMI 27.46 kg/m  Gen: Afebrile. No acute distress. Nontoxic pleasant male.  HENT: AT.  [place].  Eyes:Pupils Equal Round Reactive to light, Extraocular movements intact,  Conjunctiva without redness, discharge or icterus. Neck/lymp/endocrine: Supple,no lymphadenopathy, no thyromegaly CV: RRR no murmur, no edema Chest: CTAB, no wheeze or crackles Neuro: Normal gait. PERLA. EOMi. Alert. Oriented x3 Psych: Normal affect, dress and demeanor. Normal speech. Normal thought content and judgment.   Assessment/plan: John Morrison is a 60 y.o. male present for est care/cpe/CMC Establishing care with new doctor, encounter for Primary hypertension/HLD Stable.  Continue lisinopril 40 mg QD.  Low sodium diet- heart healthy Labs UTD > due next visit (CPE)  Anterolisthesis of lumbar spine/Spondylolisthesis of lumbar region/Chronic bilateral low back pain without sciatica He had FMLA paperwork filled out in the  past by [facility] (many years ago). Agreed to fill out for him last year.    Return in about 5 months (around 04/14/2021) for CPE (30 min), CMC (30 min).  No orders of the defined types were placed in this encounter.  Meds ordered this encounter  Medications   lisinopril (ZESTRIL) 40 MG tablet    Sig: Take 1 tablet (40 mg total) by mouth daily.    Dispense:  90 tablet    Refill:  1    Referral Orders  No referral(s) requested today     Note is dictated utilizing voice recognition software. Although note has been proof read prior to signing, occasional typographical errors still can be missed. If any questions arise, please do not hesitate to call for verification.  Electronically signed by: Howard Pouch, DO [place]

## 2020-10-30 NOTE — Patient Instructions (Addendum)
  Great to see you today.  I have refilled the medication(s) we provide.   If labs were collected, we will inform you of lab results once received either by echart message or telephone call.   - echart message- for normal results that have been seen by the patient already.   - telephone call: abnormal results or if patient has not viewed results in their echart.  Next appt would be your physical with labs- mid- March

## 2021-03-31 ENCOUNTER — Ambulatory Visit (INDEPENDENT_AMBULATORY_CARE_PROVIDER_SITE_OTHER): Payer: BC Managed Care – PPO | Admitting: Family Medicine

## 2021-03-31 ENCOUNTER — Encounter: Payer: Self-pay | Admitting: Family Medicine

## 2021-03-31 ENCOUNTER — Other Ambulatory Visit: Payer: Self-pay

## 2021-03-31 VITALS — BP 127/80 | HR 72 | Temp 97.7°F | Ht 65.0 in | Wt 170.0 lb

## 2021-03-31 DIAGNOSIS — I10 Essential (primary) hypertension: Secondary | ICD-10-CM

## 2021-03-31 DIAGNOSIS — Z0000 Encounter for general adult medical examination without abnormal findings: Secondary | ICD-10-CM

## 2021-03-31 DIAGNOSIS — E782 Mixed hyperlipidemia: Secondary | ICD-10-CM

## 2021-03-31 DIAGNOSIS — Z125 Encounter for screening for malignant neoplasm of prostate: Secondary | ICD-10-CM | POA: Diagnosis not present

## 2021-03-31 MED ORDER — LISINOPRIL 40 MG PO TABS: 40.0000 mg | ORAL_TABLET | Freq: Every day | ORAL | 1 refills | Status: DC

## 2021-03-31 NOTE — Patient Instructions (Signed)
?Great to see you today.  ?I have refilled the medication(s) we provide.  ? ?If labs were collected, we will inform you of lab results once received either by echart message or telephone call.  ? - echart message- for normal results that have been seen by the patient already.  ? - telephone call: abnormal results or if patient has not viewed results in their echart. ? ?Health Maintenance, Male ?Adopting a healthy lifestyle and getting preventive care are important in promoting health and wellness. Ask your health care provider about: ?The right schedule for you to have regular tests and exams. ?Things you can do on your own to prevent diseases and keep yourself healthy. ?What should I know about diet, weight, and exercise? ?Eat a healthy diet ? ?Eat a diet that includes plenty of vegetables, fruits, low-fat dairy products, and lean protein. ?Do not eat a lot of foods that are high in solid fats, added sugars, or sodium. ?Maintain a healthy weight ?Body mass index (BMI) is a measurement that can be used to identify possible weight problems. It estimates body fat based on height and weight. Your health care provider can help determine your BMI and help you achieve or maintain a healthy weight. ?Get regular exercise ?Get regular exercise. This is one of the most important things you can do for your health. Most adults should: ?Exercise for at least 150 minutes each week. The exercise should increase your heart rate and make you sweat (moderate-intensity exercise). ?Do strengthening exercises at least twice a week. This is in addition to the moderate-intensity exercise. ?Spend less time sitting. Even light physical activity can be beneficial. ?Watch cholesterol and blood lipids ?Have your blood tested for lipids and cholesterol at 59 years of age, then have this test every 5 years. ?You may need to have your cholesterol levels checked more often if: ?Your lipid or cholesterol levels are high. ?You are older than 59  years of age. ?You are at high risk for heart disease. ?What should I know about cancer screening? ?Many types of cancers can be detected early and may often be prevented. Depending on your health history and family history, you may need to have cancer screening at various ages. This may include screening for: ?Colorectal cancer. ?Prostate cancer. ?Skin cancer. ?Lung cancer. ?What should I know about heart disease, diabetes, and high blood pressure? ?Blood pressure and heart disease ?High blood pressure causes heart disease and increases the risk of stroke. This is more likely to develop in people who have high blood pressure readings or are overweight. ?Talk with your health care provider about your target blood pressure readings. ?Have your blood pressure checked: ?Every 3-5 years if you are 69-23 years of age. ?Every year if you are 7 years old or older. ?If you are between the ages of 22 and 23 and are a current or former smoker, ask your health care provider if you should have a one-time screening for abdominal aortic aneurysm (AAA). ?Diabetes ?Have regular diabetes screenings. This checks your fasting blood sugar level. Have the screening done: ?Once every three years after age 16 if you are at a normal weight and have a low risk for diabetes. ?More often and at a younger age if you are overweight or have a high risk for diabetes. ?What should I know about preventing infection? ?Hepatitis B ?If you have a higher risk for hepatitis B, you should be screened for this virus. Talk with your health care provider to find out  if you are at risk for hepatitis B infection. ?Hepatitis C ?Blood testing is recommended for: ?Everyone born from [birth year] through 1963. ?Anyone with known risk factors for hepatitis C. ?Sexually transmitted infections (STIs) ?You should be screened each year for STIs, including gonorrhea and chlamydia, if: ?You are sexually active and are younger than 59 years of age. ?You are older than 59 years  of age and your health care provider tells you that you are at risk for this type of infection. ?Your sexual activity has changed since you were last screened, and you are at increased risk for chlamydia or gonorrhea. Ask your health care provider if you are at risk. ?Ask your health care provider about whether you are at high risk for HIV. Your health care provider may recommend a prescription medicine to help prevent HIV infection. If you choose to take medicine to prevent HIV, you should first get tested for HIV. You should then be tested every 3 months for as long as you are taking the medicine. ?Follow these instructions at home: ?Alcohol use ?Do not drink alcohol if your health care provider tells you not to drink. ?If you drink alcohol: ?Limit how much you have to 0-2 drinks a day. ?Know how much alcohol is in your drink. In the [place] one drink equals one 12 oz bottle of beer (355 mL), one 5 oz glass of wine (148 mL), or one 1? oz glass of hard liquor (44 mL). ?Lifestyle ?Do not use any products that contain nicotine or tobacco. These products include cigarettes, chewing tobacco, and vaping devices, such as e-cigarettes. If you need help quitting, ask your health care provider. ?Do not use street drugs. ?Do not share needles. ?Ask your health care provider for help if you need support or information about quitting drugs. ?General instructions ?Schedule regular health, dental, and eye exams. ?Stay current with your vaccines. ?Tell your health care provider if: ?You often feel depressed. ?You have ever been abused or do not feel safe at home. ?Summary ?Adopting a healthy lifestyle and getting preventive care are important in promoting health and wellness. ?Follow your health care provider's instructions about healthy diet, exercising, and getting tested or screened for diseases. ?Follow your health care provider's instructions on monitoring your cholesterol and blood pressure. ?This information is not intended  to replace advice given to you by your health care provider. Make sure you discuss any questions you have with your health care provider. ?Document Revised: 06/03/2020 Document Reviewed: 06/03/2020 ?Elsevier Patient Education ? [place]. ? ?

## 2021-03-31 NOTE — Progress Notes (Signed)
This visit occurred during the SARS-CoV-2 public health emergency.  Safety protocols were in place, including screening questions prior to the visit, additional usage of staff PPE, and extensive cleaning of exam room while observing appropriate contact time as indicated for disinfecting solutions.    Patient ID: John Morrison, male  DOB: 07-03-1960, 61 y.o.   MRN: [id number] Patient Care Team    Relationship Specialty Notifications Start End  Ma Hillock, DO PCP - General Family Medicine  03/27/20   Eloise Harman, [place] Physician Internal Medicine  04/24/20     Chief Complaint  Patient presents with   Annual Exam    Pt is fasting    Subjective:  John Morrison is a 61 y.o. male present for CPE. All past medical history, surgical history, allergies, family history, immunizations, medications and social history were updated in the electronic medical record today. All recent labs, ED visits and hospitalizations within the last year were reviewed.  Health maintenance:  Colonoscopy: last screen 07/01/2020- 5 yr,Completed by Dr. Abbey Chatters Immunizations:  tdap UTD 2019, influenza declined, covid completed, shingrix declined Infectious disease screening: HIV and Hep C declined. If not completed prior, screening test offered. PSA:  Lab Results  Component Value Date   PSA 0.57 03/27/2020  , pt was counseled on prostate cancer screenings.  Assistive device: none Oxygen LOV:FIEP* Patient has a Dental home. Hospitalizations/ED visits: reviewed  HTN/HLD: Pt reports compliance with lisinopril 40 mg qd. Patient denies chest pain, shortness of breath, dizziness or lower extremity edema.   Depression screen [facility] 2/9 03/31/2021 10/30/2020 03/27/2020  Decreased Interest 0 0 0  Down, Depressed, Hopeless 0 0 0  PHQ - 2 Score 0 0 0   No flowsheet data found.      No flowsheet data found.  Immunization History  Administered Date(s) Administered   Moderna Sars-Covid-2 Vaccination  02/15/2020, 03/15/2020   Tdap 04/01/2017   Past Medical History:  Diagnosis Date   CKD (chronic kidney disease)    Hypertension    Kidney stone    No Known Allergies Past Surgical History:  Procedure Laterality Date   BIOPSY  07/01/2020   Procedure: BIOPSY;  Surgeon: Eloise Harman, DO;  [facility];  Service: Endoscopy;;   COLONOSCOPY WITH PROPOFOL N/A 07/01/2020   Procedure: COLONOSCOPY WITH PROPOFOL;  Surgeon: Eloise Harman, DO;  [facility];  Service: Endoscopy;  Laterality: N/A;  12:30pm   NO PAST SURGERIES     POLYPECTOMY  07/01/2020   Procedure: POLYPECTOMY;  Surgeon: Eloise Harman, DO;  [facility];  Service: Endoscopy;;   Family History  Adopted: Yes  Problem Relation Age of Onset   Colon cancer Neg Hx    Social History   Social History Narrative   Marital status/children/pets: Married   Education/employment: High school education, works as a Glass blower/designer.   Safety:      -smoke alarm in the home:Yes     - wears seatbelt: Yes     - Feels safe in their relationships: Yes    Allergies as of 03/31/2021   No Known Allergies      Medication List        Accurate as of March 31, 2021  2:33 PM. If you have any questions, ask your nurse or doctor.          lisinopril 40 MG tablet Commonly known as: ZESTRIL Take 1 tablet (40 mg total) by mouth daily.  All past medical history, surgical history, allergies, family history, immunizations andmedications were updated in the EMR today and reviewed under the history and medication portions of their EMR.     No results found for this or any previous visit (from the past 2160 hour(s)).  No results found.  ROS 14 pt review of systems performed and negative (unless mentioned in an HPI)  Objective: BP 127/80    Pulse 72    Temp 97.7 F (36.5 C) (Oral)    Ht '5\' 5"'$  (1.651 m)    Wt 170 lb (77.1 kg)    SpO2 99%    BMI 28.29 kg/m  Physical Exam Vitals and nursing note  reviewed.  Constitutional:      General: He is not in acute distress.    Appearance: Normal appearance. He is not ill-appearing, toxic-appearing or diaphoretic.  HENT:     Head: Normocephalic and atraumatic.     Right Ear: Tympanic membrane, ear canal and external ear normal. There is no impacted cerumen.     Left Ear: Tympanic membrane, ear canal and external ear normal. There is no impacted cerumen.     Nose: Nose normal. No congestion or rhinorrhea.     Mouth/Throat:     Mouth: Mucous membranes are moist.     Pharynx: Oropharynx is clear. No oropharyngeal exudate or posterior oropharyngeal erythema.  Eyes:     General: No scleral icterus.       Right eye: No discharge.        Left eye: No discharge.     Extraocular Movements: Extraocular movements intact.     Pupils: Pupils are equal, round, and reactive to light.  Cardiovascular:     Rate and Rhythm: Normal rate and regular rhythm.     Pulses: Normal pulses.     Heart sounds: Normal heart sounds. No murmur heard.   No friction rub. No gallop.  Pulmonary:     Effort: Pulmonary effort is normal. No respiratory distress.     Breath sounds: Normal breath sounds. No stridor. No wheezing, rhonchi or rales.  Chest:     Chest wall: No tenderness.  Abdominal:     General: Abdomen is flat. Bowel sounds are normal. There is no distension.     Palpations: Abdomen is soft. There is no mass.     Tenderness: There is no abdominal tenderness. There is no right CVA tenderness, left CVA tenderness, guarding or rebound.     Hernia: No hernia is present.  Musculoskeletal:        General: No swelling or tenderness. Normal range of motion.     Cervical back: Normal range of motion and neck supple.     Right lower leg: No edema.     Left lower leg: No edema.  Lymphadenopathy:     Cervical: No cervical adenopathy.  Skin:    General: Skin is warm and dry.     Coloration: Skin is not jaundiced.     Findings: No bruising, lesion or rash.   Neurological:     General: No focal deficit present.     Mental Status: He is alert and oriented to person, place, and time. Mental status is at baseline.     Cranial Nerves: No cranial nerve deficit.     Sensory: No sensory deficit.     Motor: No weakness.     Coordination: Coordination normal.     Gait: Gait normal.     Deep Tendon Reflexes: Reflexes normal.  Psychiatric:  Mood and Affect: Mood normal.        Behavior: Behavior normal.        Thought Content: Thought content normal.        Judgment: Judgment normal.    No results found.  Assessment/plan: John Morrison is a 61 y.o. male present for CPE Primary hypertension/HLD Stable.  Continue lisinopril 40 mg QD.  Low sodium diet- heart healthy Cbc, cmp,tsh, lipids collected today   Anterolisthesis of lumbar spine/Spondylolisthesis of lumbar region/Chronic bilateral low back pain without sciatica He had FMLA paperwork filled out in the past by [facility] (many years ago). Agreed to fill out for him yearly if needed   Prostate cancer screening - PSA Routine general medical examination at a health care facility Colonoscopy: last screen 07/01/2020- 5 yr,Completed by Dr. Abbey Chatters Immunizations:  tdap UTD 2019, influenza declined, covid completed, shingrix declined Infectious disease screening: HIV and Hep C declined. If not completed prior, screening test offered. Patient was encouraged to exercise greater than 150 minutes a week. Patient was encouraged to choose a diet filled with fresh fruits and vegetables, and lean meats. AVS provided to patient today for education/recommendation on gender specific health and safety maintenance.  Return in about 24 weeks (around 09/15/2021) for CMC (30 min).  Orders Placed This Encounter  Procedures   Comprehensive metabolic panel   CBC   Hemoglobin A1c   Lipid panel   PSA   TSH   No orders of the defined types were placed in this encounter.  Referral Orders  No referral(s) requested  today     Note is dictated utilizing voice recognition software. Although note has been proof read prior to signing, occasional typographical errors still can be missed. If any questions arise, please do not hesitate to call for verification.  Electronically signed by: Howard Pouch, DO [place]

## 2021-04-01 LAB — CBC
HCT: 43.5 % (ref 38.5–50.0)
Hemoglobin: 14.9 g/dL (ref 13.2–17.1)
MCH: 29 pg (ref 27.0–33.0)
MCHC: 34.3 g/dL (ref 32.0–36.0)
MCV: 84.6 fL (ref 80.0–100.0)
MPV: 12.5 fL (ref 7.5–12.5)
Platelets: 138 10*3/uL — ABNORMAL LOW (ref 140–400)
RBC: 5.14 10*6/uL (ref 4.20–5.80)
RDW: 13.4 % (ref 11.0–15.0)
WBC: 5.6 10*3/uL (ref 3.8–10.8)

## 2021-04-01 LAB — COMPREHENSIVE METABOLIC PANEL
AG Ratio: 1.6 (calc) (ref 1.0–2.5)
ALT: 12 U/L (ref 9–46)
AST: 12 U/L (ref 10–35)
Albumin: 4.2 g/dL (ref 3.6–5.1)
Alkaline phosphatase (APISO): 60 U/L (ref 35–144)
BUN: 22 mg/dL (ref 7–25)
CO2: 20 mmol/L (ref 20–32)
Calcium: 9.2 mg/dL (ref 8.6–10.3)
Chloride: 106 mmol/L (ref 98–110)
Creat: 1.16 mg/dL (ref 0.70–1.30)
Globulin: 2.6 g/dL (calc) (ref 1.9–3.7)
Glucose, Bld: 92 mg/dL (ref 65–99)
Potassium: 4.1 mmol/L (ref 3.5–5.3)
Sodium: 139 mmol/L (ref 135–146)
Total Bilirubin: 0.6 mg/dL (ref 0.2–1.2)
Total Protein: 6.8 g/dL (ref 6.1–8.1)

## 2021-04-01 LAB — LIPID PANEL
Cholesterol: 157 mg/dL (ref <200)
HDL: 42 mg/dL (ref >=40)
LDL Cholesterol (Calc): 98 mg/dL (calc)
Non-HDL Cholesterol (Calc): 115 mg/dL (calc) (ref <130)
Total CHOL/HDL Ratio: 3.7 (calc) (ref <5.0)
Triglycerides: 78 mg/dL (ref <150)

## 2021-04-01 LAB — HEMOGLOBIN A1C
Hgb A1c MFr Bld: 5 % of total Hgb (ref <5.7)
Mean Plasma Glucose: 97 mg/dL
eAG (mmol/L): 5.4 mmol/L

## 2021-04-01 LAB — PSA: PSA: 0.81 ng/mL (ref <=4.00)

## 2021-04-01 LAB — TSH: TSH: 2.28 mIU/L (ref 0.40–4.50)

## 2021-04-29 ENCOUNTER — Telehealth: Payer: Self-pay

## 2021-04-29 NOTE — Telephone Encounter (Signed)
Type of forms received: FMLA ? ?Routed to: Team [place] ? ?Paperwork received by :  Patient ? ?Patient is requesting to mail back copies of forms and records to his home address. ? ?

## 2021-04-30 NOTE — Telephone Encounter (Signed)
Form placed on PCP desk  ?

## 2021-05-01 NOTE — Telephone Encounter (Signed)
Forms faxed

## 2021-05-01 NOTE — Telephone Encounter (Signed)
Completed and returned to CMA work basket ?

## 2021-05-05 NOTE — Telephone Encounter (Signed)
noted 

## 2021-05-05 NOTE — Telephone Encounter (Signed)
FYI: ?Sent file copy to scan with labels on it. ?Mailed original copy to pt address. ?

## 2021-05-16 NOTE — Telephone Encounter (Signed)
Spoke with pt and informed him that we are still missing the following pages and that we will need to have that sent to [place] to complete.  ?

## 2021-05-16 NOTE — Telephone Encounter (Signed)
LVM for pt to CB regarding paperwork.  ?

## 2021-05-16 NOTE — Telephone Encounter (Signed)
Called Shirlee Limerick benefit specialist to [facility] and/or fax all pages to our office. We are currently missing this years pages 3 and 4.  ?

## 2021-05-16 NOTE — Telephone Encounter (Signed)
Patient came to office - needs FMLA paperwork completed asap, 2 documents were missing, he brought in new documents yesterday, I told him Dr. Raoul Pitch is not here in the afternoon on Thursdays. ?I gave forms to [facility] 4/20. ?I told he needed to call his HR depts and ask for an extension, he said they told him he needed forms 2 days ago.  I have tried to stress the importance to him that he has to ask for an extension. ? ?

## 2021-05-20 NOTE — Telephone Encounter (Signed)
Fax received from [employer]. ?

## 2021-09-15 ENCOUNTER — Encounter: Payer: Self-pay | Admitting: Family Medicine

## 2021-09-15 ENCOUNTER — Ambulatory Visit: Payer: BC Managed Care – PPO | Admitting: Family Medicine

## 2021-09-15 VITALS — BP 133/73 | HR 72 | Temp 97.9°F | Ht 65.0 in | Wt 174.8 lb

## 2021-09-15 DIAGNOSIS — I10 Essential (primary) hypertension: Secondary | ICD-10-CM

## 2021-09-15 DIAGNOSIS — E782 Mixed hyperlipidemia: Secondary | ICD-10-CM

## 2021-09-15 MED ORDER — LISINOPRIL 40 MG PO TABS: 40.0000 mg | ORAL_TABLET | Freq: Every day | ORAL | 1 refills | Status: DC

## 2021-09-15 NOTE — Progress Notes (Signed)
Patient ID: John Morrison, male  DOB: 08-12-1960, 61 y.o.   MRN: [id number] Patient Care Team    Relationship Specialty Notifications Start End  Ma Hillock, DO PCP - General Family Medicine  03/27/20   Eloise Harman, DO Consulting Physician Internal Medicine  04/24/20     Chief Complaint  Patient presents with   Hypertension    Subjective:  John Morrison is a 61 y.o. male present for [facility] All past medical history, surgical history, allergies, family history, immunizations, medications and social history were updated in the electronic medical record today. All recent labs, ED visits and hospitalizations within the last year were reviewed.   HTN/HLD: Pt reports compliance with lisinopril 40 mg daily. Patient denies chest pain, shortness of breath, dizziness or lower extremity edema.       09/15/2021    2:01 PM 03/31/2021    2:28 PM 10/30/2020   11:00 AM 03/27/2020    1:27 PM  Depression screen PHQ 2/9  Decreased Interest 0 0 0 0  Down, Depressed, Hopeless 0 0 0 0  PHQ - 2 Score 0 0 0 0       No data to display                  No data to display          Immunization History  Administered Date(s) Administered   Moderna Sars-Covid-2 Vaccination 02/15/2020, 03/15/2020   Tdap 04/01/2017   Past Medical History:  Diagnosis Date   CKD (chronic kidney disease)    Hypertension    Kidney stone    No Known Allergies Past Surgical History:  Procedure Laterality Date   BIOPSY  07/01/2020   Procedure: BIOPSY;  Surgeon: Eloise Harman, DO;  [facility];  Service: Endoscopy;;   COLONOSCOPY WITH PROPOFOL N/A 07/01/2020   Procedure: COLONOSCOPY WITH PROPOFOL;  Surgeon: Eloise Harman, DO;  [facility];  Service: Endoscopy;  Laterality: N/A;  12:30pm   NO PAST SURGERIES     POLYPECTOMY  07/01/2020   Procedure: POLYPECTOMY;  Surgeon: Eloise Harman, DO;  [facility];  Service: Endoscopy;;   Family History  Adopted: Yes  Problem  Relation Age of Onset   Colon cancer Neg Hx    Social History   Social History Narrative   Marital status/children/pets: Married   Education/employment: High school education, works as a Glass blower/designer.   Safety:      -smoke alarm in the home:Yes     - wears seatbelt: Yes     - Feels safe in their relationships: Yes    Allergies as of 09/15/2021   No Known Allergies      Medication List        Accurate as of September 15, 2021  2:12 PM. If you have any questions, ask your nurse or doctor.          lisinopril 40 MG tablet Commonly known as: ZESTRIL Take 1 tablet (40 mg total) by mouth daily.       All past medical history, surgical history, allergies, family history, immunizations andmedications were updated in the EMR today and reviewed under the history and medication portions of their EMR.     No results found for this or any previous visit (from the past 2160 hour(s)).  No results found.  ROS 14 pt review of systems performed and negative (unless mentioned in an HPI)  Objective: BP 133/73  Pulse 72   Temp 97.9 F (36.6 C)   Ht '5\' 5"'$  (1.651 m)   Wt 174 lb 12.8 oz (79.3 kg)   SpO2 99%   BMI 29.09 kg/m  Physical Exam Vitals and nursing note reviewed.  Constitutional:      General: He is not in acute distress.    Appearance: Normal appearance. He is not ill-appearing, toxic-appearing or diaphoretic.  HENT:     Head: Normocephalic and atraumatic.  Eyes:     General: No scleral icterus.       Right eye: No discharge.        Left eye: No discharge.     Extraocular Movements: Extraocular movements intact.     Pupils: Pupils are equal, round, and reactive to light.  Cardiovascular:     Rate and Rhythm: Normal rate and regular rhythm.  Pulmonary:     Effort: Pulmonary effort is normal. No respiratory distress.     Breath sounds: Normal breath sounds. No wheezing, rhonchi or rales.  Musculoskeletal:     Cervical back: Neck supple.     Right lower leg:  No edema.     Left lower leg: No edema.  Lymphadenopathy:     Cervical: No cervical adenopathy.  Skin:    General: Skin is warm and dry.     Coloration: Skin is not jaundiced or pale.     Findings: No rash.  Neurological:     Mental Status: He is alert and oriented to person, place, and time. Mental status is at baseline.  Psychiatric:        Mood and Affect: Mood normal.        Behavior: Behavior normal.        Thought Content: Thought content normal.        Judgment: Judgment normal.     No results found.  Assessment/plan: John Morrison is a 61 y.o. male present for CPE Primary hypertension/HLD Stable Continue lisinopril 40 mg QD.  Low sodium diet- heart healthy Labs due next visit  Anterolisthesis of lumbar spine/Spondylolisthesis of lumbar region/Chronic bilateral low back pain without sciatica He had FMLA paperwork filled out in the past by [employer] (many years ago). Agreed to fill out for him yearly if needed   Return in about 7 months (around 04/02/2022) for cpe (20 min), Routine chronic condition follow-up.  No orders of the defined types were placed in this encounter.  Meds ordered this encounter  Medications   lisinopril (ZESTRIL) 40 MG tablet    Sig: Take 1 tablet (40 mg total) by mouth daily.    Dispense:  90 tablet    Refill:  1   Referral Orders  No referral(s) requested today     Note is dictated utilizing voice recognition software. Although note has been proof read prior to signing, occasional typographical errors still can be missed. If any questions arise, please do not hesitate to call for verification.  Electronically signed by: Howard Pouch, DO [place]

## 2021-09-15 NOTE — Patient Instructions (Addendum)
Return in about 7 months (around 04/02/2022) for cpe (20 min), Routine chronic condition follow-up.        Great to see you today.  I have refilled the medication(s) we provide.   If labs were collected, we will inform you of lab results once received either by echart message or telephone call.   - echart message- for normal results that have been seen by the patient already.   - telephone call: abnormal results or if patient has not viewed results in their echart.

## 2022-02-26 DIAGNOSIS — U071 COVID-19: Secondary | ICD-10-CM | POA: Diagnosis not present

## 2022-04-02 ENCOUNTER — Ambulatory Visit (INDEPENDENT_AMBULATORY_CARE_PROVIDER_SITE_OTHER): Payer: BC Managed Care – PPO | Admitting: Family Medicine

## 2022-04-02 ENCOUNTER — Encounter: Payer: Self-pay | Admitting: Family Medicine

## 2022-04-02 VITALS — BP 142/82 | HR 65 | Temp 98.0°F | Ht 66.54 in | Wt 173.6 lb

## 2022-04-02 DIAGNOSIS — Z0000 Encounter for general adult medical examination without abnormal findings: Secondary | ICD-10-CM

## 2022-04-02 DIAGNOSIS — I10 Essential (primary) hypertension: Secondary | ICD-10-CM

## 2022-04-02 DIAGNOSIS — E782 Mixed hyperlipidemia: Secondary | ICD-10-CM

## 2022-04-02 DIAGNOSIS — Z131 Encounter for screening for diabetes mellitus: Secondary | ICD-10-CM | POA: Diagnosis not present

## 2022-04-02 DIAGNOSIS — Z130 Encounter for screening for diseases of the blood and blood-forming organs and certain disorders involving the immune mechanism: Secondary | ICD-10-CM

## 2022-04-02 DIAGNOSIS — Z125 Encounter for screening for malignant neoplasm of prostate: Secondary | ICD-10-CM

## 2022-04-02 LAB — CBC
HCT: 41.1 % (ref 39.0–52.0)
Hemoglobin: 14.5 g/dL (ref 13.0–17.0)
MCHC: 35.2 g/dL (ref 30.0–36.0)
MCV: 82.8 fl (ref 78.0–100.0)
Platelets: 131 10*3/uL — ABNORMAL LOW (ref 150.0–400.0)
RBC: 4.97 Mil/uL (ref 4.22–5.81)
RDW: 14.7 % (ref 11.5–15.5)
WBC: 4.8 10*3/uL (ref 4.0–10.5)

## 2022-04-02 LAB — COMPREHENSIVE METABOLIC PANEL
ALT: 14 U/L (ref 0–53)
AST: 15 U/L (ref 0–37)
Albumin: 3.9 g/dL (ref 3.5–5.2)
Alkaline Phosphatase: 68 U/L (ref 39–117)
BUN: 20 mg/dL (ref 6–23)
CO2: 29 mEq/L (ref 19–32)
Calcium: 9.3 mg/dL (ref 8.4–10.5)
Chloride: 106 mEq/L (ref 96–112)
Creatinine, Ser: 1.15 mg/dL (ref 0.40–1.50)
GFR: 69.34 mL/min (ref >60.00)
Glucose, Bld: 88 mg/dL (ref 70–99)
Potassium: 3.8 mEq/L (ref 3.5–5.1)
Sodium: 141 mEq/L (ref 135–145)
Total Bilirubin: 0.5 mg/dL (ref 0.2–1.2)
Total Protein: 6.5 g/dL (ref 6.0–8.3)

## 2022-04-02 LAB — LIPID PANEL
Cholesterol: 144 mg/dL (ref 0–200)
HDL: 40.2 mg/dL (ref >39.00)
LDL Cholesterol: 89 mg/dL (ref 0–99)
NonHDL: 103.61
Total CHOL/HDL Ratio: 4
Triglycerides: 73 mg/dL (ref 0.0–149.0)
VLDL: 14.6 mg/dL (ref 0.0–40.0)

## 2022-04-02 LAB — HEMOGLOBIN A1C: Hgb A1c MFr Bld: 5 % (ref 4.6–6.5)

## 2022-04-02 LAB — PSA: PSA: 1.27 ng/mL (ref 0.10–4.00)

## 2022-04-02 LAB — TSH: TSH: 2.47 u[IU]/mL (ref 0.35–5.50)

## 2022-04-02 MED ORDER — LISINOPRIL 40 MG PO TABS: 40.0000 mg | ORAL_TABLET | Freq: Every day | ORAL | 1 refills | Status: DC

## 2022-04-02 NOTE — Progress Notes (Signed)
Patient ID: John Morrison, male  DOB: 05/29/1960, 62 y.o.   MRN: [id number] Patient Care Team    Relationship Specialty Notifications Start End  Ma Hillock, DO PCP - General Family Medicine  03/27/20   Eloise Harman, DO Consulting Physician Internal Medicine  04/24/20     Chief Complaint  Patient presents with   Annual Exam    [facility]; pt is not fasting    Subjective:  John Morrison is a 62 y.o. male present for CPE and Chronic Conditions/illness Management  All past medical history, surgical history, allergies, family history, immunizations, medications and social history were updated in the electronic medical record today. All recent labs, ED visits and hospitalizations within the last year were reviewed.  Health maintenance:  Colonoscopy: last screen 07/01/2020- 5 yr,Completed by Dr. Abbey Chatters Immunizations:  tdap UTD 2019, influenza declined, shingrix declined Infectious disease screening: HIV and Hep C declined.  PSA:  Lab Results  Component Value Date   PSA 0.81 03/31/2021   PSA 0.57 03/27/2020  , pt was counseled on prostate cancer screenings.  Assistive device: none Oxygen use: None Patient has a Dental home. Hospitalizations/ED visits: reviewed  HTN/HLD: Pt reports compliance  with lisinopril 40 mg qd.  Patient denies chest pain, shortness of breath, dizziness or lower extremity edema.        04/02/2022   10:47 AM 09/15/2021    2:01 PM 03/31/2021    2:28 PM 10/30/2020   11:00 AM 03/27/2020    1:27 PM  Depression screen PHQ 2/9  Decreased Interest 0 0 0 0 0  Down, Depressed, Hopeless 0 0 0 0 0  PHQ - 2 Score 0 0 0 0 0       No data to display                 04/02/2022   10:47 AM  Fall Risk   Falls in the past year? 0  Number falls in past yr: 0  Injury with Fall? 0  Follow up Falls evaluation completed    Immunization History  Administered Date(s) Administered   Moderna Sars-Covid-2 Vaccination 02/15/2020, 03/15/2020   Tdap 04/01/2017    Past Medical History:  Diagnosis Date   CKD (chronic kidney disease)    Hypertension    Kidney stone    No Known Allergies Past Surgical History:  Procedure Laterality Date   BIOPSY  07/01/2020   Procedure: BIOPSY;  Surgeon: Eloise Harman, DO;  [facility];  Service: Endoscopy;;   COLONOSCOPY WITH PROPOFOL N/A 07/01/2020   Procedure: COLONOSCOPY WITH PROPOFOL;  Surgeon: Eloise Harman, DO;  [facility];  Service: Endoscopy;  Laterality: N/A;  12:30pm   NO PAST SURGERIES     POLYPECTOMY  07/01/2020   Procedure: POLYPECTOMY;  Surgeon: Eloise Harman, DO;  [facility];  Service: Endoscopy;;   Family History  Adopted: Yes  Problem Relation Age of Onset   Colon cancer Neg Hx    Social History   Social History Narrative   Marital status/children/pets: Married   Education/employment: High school education, works as a Glass blower/designer.   Safety:      -smoke alarm in the home:Yes     - wears seatbelt: Yes     - Feels safe in their relationships: Yes    Allergies as of 04/02/2022   No Known Allergies      Medication List  Accurate as of April 02, 2022 10:57 AM. If you have any questions, ask your nurse or doctor.          lisinopril 40 MG tablet Commonly known as: ZESTRIL Take 1 tablet (40 mg total) by mouth daily.       All past medical history, surgical history, allergies, family history, immunizations andmedications were updated in the EMR today and reviewed under the history and medication portions of their EMR.     No results found for this or any previous visit (from the past 2160 hour(s)).  No results found.  ROS 14 pt review of systems performed and negative (unless mentioned in an HPI)  Objective: BP (!) 145/76   Pulse 65   Temp 98 F (36.7 C)   Ht 5' 6.54" (1.69 m)   Wt 173 lb 9.6 oz (78.7 kg)   SpO2 98%   BMI 27.57 kg/m  Physical Exam Vitals and nursing note reviewed.  Constitutional:       General: He is not in acute distress.    Appearance: Normal appearance. He is not ill-appearing, toxic-appearing or diaphoretic.  HENT:     Head: Normocephalic and atraumatic.     Right Ear: Tympanic membrane, ear canal and external ear normal. There is no impacted cerumen.     Left Ear: Tympanic membrane, ear canal and external ear normal. There is no impacted cerumen.     Nose: Nose normal. No congestion or rhinorrhea.     Mouth/Throat:     Mouth: Mucous membranes are moist.     Pharynx: Oropharynx is clear. No oropharyngeal exudate or posterior oropharyngeal erythema.  Eyes:     General: No scleral icterus.       Right eye: No discharge.        Left eye: No discharge.     Extraocular Movements: Extraocular movements intact.     Pupils: Pupils are equal, round, and reactive to light.  Cardiovascular:     Rate and Rhythm: Normal rate and regular rhythm.     Pulses: Normal pulses.     Heart sounds: Normal heart sounds. No murmur heard.    No friction rub. No gallop.  Pulmonary:     Effort: Pulmonary effort is normal. No respiratory distress.     Breath sounds: Normal breath sounds. No stridor. No wheezing, rhonchi or rales.  Chest:     Chest wall: No tenderness.  Abdominal:     General: Abdomen is flat. Bowel sounds are normal. There is no distension.     Palpations: Abdomen is soft. There is no mass.     Tenderness: There is no abdominal tenderness. There is no right CVA tenderness, left CVA tenderness, guarding or rebound.     Hernia: No hernia is present.  Musculoskeletal:        General: No swelling or tenderness. Normal range of motion.     Cervical back: Normal range of motion and neck supple.     Right lower leg: No edema.     Left lower leg: No edema.  Lymphadenopathy:     Cervical: No cervical adenopathy.  Skin:    General: Skin is warm and dry.     Coloration: Skin is not jaundiced.     Findings: No bruising, lesion or rash.  Neurological:     General: No focal  deficit present.     Mental Status: He is alert and oriented to person, place, and time. Mental status is at baseline.  Cranial Nerves: No cranial nerve deficit.     Sensory: No sensory deficit.     Motor: No weakness.     Coordination: Coordination normal.     Gait: Gait normal.     Deep Tendon Reflexes: Reflexes normal.  Psychiatric:        Mood and Affect: Mood normal.        Behavior: Behavior normal.        Thought Content: Thought content normal.        Judgment: Judgment normal.     No results found.  Assessment/plan: John Morrison is a 62 y.o. male present for CPE and Chronic Conditions/illness Management Primary hypertension/HLD Stable- borderline. Meds due now. Will wait to next appt and he will be sure to take meds at least 2 hours prior to appt.  Continue lisinopril 40 mg QD.  Low sodium diet- heart healthy CBC, CMP, TSH and lipids collected today   Anterolisthesis of lumbar spine/Spondylolisthesis of lumbar region/Chronic bilateral low back pain without sciatica He had FMLA paperwork filled out in the past by [employer] (many years ago). Agreed to fill out for him yearly if needed Diabetes screening: A1c collected today Prostate cancer screening PSA collected today Routine general medical examination at a health care facility Patient was encouraged to exercise greater than 150 minutes a week. Patient was encouraged to choose a diet filled with fresh fruits and vegetables, and lean meats. AVS provided to patient today for education/recommendation on gender specific health and safety maintenance. Colonoscopy: last screen 07/01/2020- 5 yr,Completed by Dr. Abbey Chatters Immunizations:  tdap UTD 2019, influenza declined, shingrix declined Infectious disease screening: HIV and Hep C declined.   Return in about 25 weeks (around 09/24/2022) for Routine chronic condition follow-up.  Orders Placed This Encounter  Procedures   CBC   Comprehensive metabolic panel   Hemoglobin A1c   TSH    PSA   Lipid panel   Meds ordered this encounter  Medications   lisinopril (ZESTRIL) 40 MG tablet    Sig: Take 1 tablet (40 mg total) by mouth daily.    Dispense:  90 tablet    Refill:  1   Referral Orders  No referral(s) requested today     Note is dictated utilizing voice recognition software. Although note has been proof read prior to signing, occasional typographical errors still can be missed. If any questions arise, please do not hesitate to call for verification.  Electronically signed by: Howard Pouch, DO [place]

## 2022-04-02 NOTE — Patient Instructions (Signed)
Return in about 25 weeks (around 09/24/2022) for Routine chronic condition follow-up.        Great to see you today.  I have refilled the medication(s) we provide.   If labs were collected, we will inform you of lab results once received either by echart message or telephone call.   - echart message- for normal results that have been seen by the patient already.   - telephone call: abnormal results or if patient has not viewed results in their echart.

## 2022-06-05 ENCOUNTER — Telehealth: Payer: Self-pay

## 2022-06-05 DIAGNOSIS — Z0279 Encounter for issue of other medical certificate: Secondary | ICD-10-CM

## 2022-06-05 NOTE — Telephone Encounter (Signed)
Type of forms received: FMLA renewal   Routed to: Team [facility]  Paperwork received by :  Annabelle Harman   Individual made aware of 5-7 business day turn around (Y/N): Y  (due to patient employer by 06/11/22)  Form completed and patient made aware of charges(Y/N): Y   Faxed to :  Please fax to patient employer when completed., on or before 06/11/22.  Form location:  [place] inbox - front office

## 2022-06-09 NOTE — Telephone Encounter (Signed)
Completed and placed in CMA work basket 

## 2022-06-09 NOTE — Telephone Encounter (Signed)
Forms placed on PCP desk for completion. 

## 2022-06-10 NOTE — Telephone Encounter (Signed)
Faxed

## 2022-09-24 ENCOUNTER — Ambulatory Visit: Payer: BC Managed Care – PPO | Admitting: Family Medicine

## 2022-10-14 ENCOUNTER — Encounter: Payer: Self-pay | Admitting: Family Medicine

## 2022-10-14 ENCOUNTER — Ambulatory Visit (INDEPENDENT_AMBULATORY_CARE_PROVIDER_SITE_OTHER): Payer: BC Managed Care – PPO | Admitting: Family Medicine

## 2022-10-14 VITALS — BP 134/72 | HR 65 | Temp 97.7°F | Wt 174.0 lb

## 2022-10-14 DIAGNOSIS — I10 Essential (primary) hypertension: Secondary | ICD-10-CM

## 2022-10-14 DIAGNOSIS — E782 Mixed hyperlipidemia: Secondary | ICD-10-CM

## 2022-10-14 MED ORDER — LISINOPRIL 40 MG PO TABS: 40.0000 mg | ORAL_TABLET | Freq: Every day | ORAL | 1 refills | Status: DC

## 2022-10-14 NOTE — Patient Instructions (Addendum)
Return in about 6 months (around 04/05/2023) for cpe (20 min), Routine chronic condition follow-up.        Great to see you today.  I have refilled the medication(s) we provide.   If labs were collected or images ordered, we will inform you of  results once we have received them and reviewed. We will contact you either by echart message, or telephone call.  Please give ample time to the testing facility, and our office to run,  receive and review results. Please do not call inquiring of results, even if you can see them in your chart. We will contact you as soon as we are able. If it has been over 1 week since the test was completed, and you have not yet heard from [place], then please call us.    - echart message- for normal results that have been seen by the patient already.   - telephone call: abnormal results or if patient has not viewed results in their echart.  If a referral to a specialist was entered for you, please call us in 2 weeks if you have not heard from the specialist office to schedule.

## 2022-10-14 NOTE — Progress Notes (Signed)
Patient ID: John Morrison, male  DOB: 13-Aug-1960, 62 y.o.   MRN: [id number] Patient Care Team    Relationship Specialty Notifications Start End  Natalia Leatherwood, DO PCP - General Family Medicine  03/27/20   Lanelle Bal, DO Consulting Physician Internal Medicine  04/24/20     Chief Complaint  Patient presents with   Hypertension    Subjective: John Morrison is a 62 y.o. male present for Chronic Conditions/illness Management All past medical history, surgical history, allergies, family history, immunizations, medications and social history were updated in the electronic medical record today. All recent labs, ED visits and hospitalizations within the last year were reviewed.  HTN/HLD: Pt reports compliance with lisinopril 40 mg qd.  Patient denies chest pain, shortness of breath, dizziness or lower extremity edema.      04/02/2022   10:47 AM 09/15/2021    2:01 PM 03/31/2021    2:28 PM 10/30/2020   11:00 AM 03/27/2020    1:27 PM  Depression screen PHQ 2/9  Decreased Interest 0 0 0 0 0  Down, Depressed, Hopeless 0 0 0 0 0  PHQ - 2 Score 0 0 0 0 0       No data to display                04/02/2022   10:47 AM  Fall Risk   Falls in the past year? 0  Number falls in past yr: 0  Injury with Fall? 0  Follow up Falls evaluation completed    Immunization History  Administered Date(s) Administered   Moderna Sars-Covid-2 Vaccination 02/15/2020, 03/15/2020   Tdap 04/01/2017   Past Medical History:  Diagnosis Date   CKD (chronic kidney disease)    Hypertension    Kidney stone    No Known Allergies Past Surgical History:  Procedure Laterality Date   BIOPSY  07/01/2020   Procedure: BIOPSY;  Surgeon: Lanelle Bal, DO;  [facility];  Service: Endoscopy;;   COLONOSCOPY WITH PROPOFOL N/A 07/01/2020   Procedure: COLONOSCOPY WITH PROPOFOL;  Surgeon: Lanelle Bal, DO;  [facility];  Service: Endoscopy;  Laterality: N/A;  12:30pm   NO PAST  SURGERIES     POLYPECTOMY  07/01/2020   Procedure: POLYPECTOMY;  Surgeon: Lanelle Bal, DO;  [facility];  Service: Endoscopy;;   Family History  Adopted: Yes  Problem Relation Age of Onset   Colon cancer Neg Hx    Social History   Social History Narrative   Marital status/children/pets: Married   Education/employment: High school education, works as a Location manager.   Safety:      -smoke alarm in the home:Yes     - wears seatbelt: Yes     - Feels safe in their relationships: Yes    Allergies as of 10/14/2022   No Known Allergies      Medication List        Accurate as of October 14, 2022  1:22 PM. If you have any questions, ask your nurse or doctor.          lisinopril 40 MG tablet Commonly known as: ZESTRIL Take 1 tablet (40 mg total) by mouth daily.       All past medical history, surgical history, allergies, family history, immunizations andmedications were updated in the EMR today and reviewed under the history and medication portions of their EMR.     No results found for this or  any previous visit (from the past 2160 hour(s)).  No results found.  ROS 14 pt review of systems performed and negative (unless mentioned in an HPI)  Objective: BP 134/72   Pulse 65   Temp 97.7 F (36.5 C)   Wt 174 lb (78.9 kg)   SpO2 100%   BMI 27.63 kg/m  Physical Exam Vitals and nursing note reviewed.  Constitutional:      General: He is not in acute distress.    Appearance: Normal appearance. He is not ill-appearing, toxic-appearing or diaphoretic.  HENT:     Head: Normocephalic and atraumatic.  Eyes:     General: No scleral icterus.       Right eye: No discharge.        Left eye: No discharge.     Extraocular Movements: Extraocular movements intact.     Pupils: Pupils are equal, round, and reactive to light.  Cardiovascular:     Rate and Rhythm: Normal rate and regular rhythm.  Pulmonary:     Effort: Pulmonary effort is normal. No  respiratory distress.     Breath sounds: Normal breath sounds. No wheezing, rhonchi or rales.  Musculoskeletal:     Right lower leg: No edema.     Left lower leg: No edema.  Skin:    General: Skin is warm.     Findings: No rash.  Neurological:     Mental Status: He is alert and oriented to person, place, and time. Mental status is at baseline.  Psychiatric:        Mood and Affect: Mood normal.        Behavior: Behavior normal.        Thought Content: Thought content normal.        Judgment: Judgment normal.     No results found.  Assessment/plan: John Morrison is a 62 y.o. male present for Chronic Conditions/illness Management Primary hypertension/HLD Stable Continue lisinopril 40 mg QD.  Low sodium diet- heart healthy Labs up-to-date-due next visit   Anterolisthesis of lumbar spine/Spondylolisthesis of lumbar region/Chronic bilateral low back pain without sciatica He had FMLA paperwork filled out in the past by [employer] (many years ago). Agreed to fill out for him yearly if needed  No follow-ups on file.  No orders of the defined types were placed in this encounter.  Meds ordered this encounter  Medications   lisinopril (ZESTRIL) 40 MG tablet    Sig: Take 1 tablet (40 mg total) by mouth daily.    Dispense:  90 tablet    Refill:  1   Referral Orders  No referral(s) requested today     Note is dictated utilizing voice recognition software. Although note has been proof read prior to signing, occasional typographical errors still can be missed. If any questions arise, please do not hesitate to call for verification.  Electronically signed by: Felix Pacini, DO [facility] [place]

## 2023-04-06 ENCOUNTER — Ambulatory Visit: Admitting: Family Medicine

## 2023-04-06 ENCOUNTER — Encounter: Payer: Self-pay | Admitting: Family Medicine

## 2023-04-06 ENCOUNTER — Ambulatory Visit (HOSPITAL_BASED_OUTPATIENT_CLINIC_OR_DEPARTMENT_OTHER)
Admission: RE | Admit: 2023-04-06 | Discharge: 2023-04-06 | Disposition: A | Discharge status: Home / Self Care | Source: Ambulatory Visit | Attending: Family Medicine | Admitting: Family Medicine

## 2023-04-06 ENCOUNTER — Ambulatory Visit: Payer: Self-pay | Admitting: Family Medicine

## 2023-04-06 VITALS — BP 134/86 | HR 60 | Temp 98.1°F | Wt 177.0 lb

## 2023-04-06 DIAGNOSIS — R42 Dizziness and giddiness: Secondary | ICD-10-CM | POA: Insufficient documentation

## 2023-04-06 DIAGNOSIS — R519 Headache, unspecified: Secondary | ICD-10-CM | POA: Diagnosis not present

## 2023-04-06 LAB — POC INFLUENZA A&B (BINAX/QUICKVUE)
Influenza A, POC: NEGATIVE
Influenza B, POC: NEGATIVE

## 2023-04-06 LAB — POC COVID19 BINAXNOW: SARS Coronavirus 2 Ag: NEGATIVE

## 2023-04-06 MED ORDER — LISINOPRIL 40 MG PO TABS: 40.0000 mg | ORAL_TABLET | Freq: Every day | ORAL | 0 refills | Status: DC

## 2023-04-06 MED ORDER — ONDANSETRON 4 MG PO TBDP: 4.0000 mg | ORAL_TABLET | Freq: Three times a day (TID) | ORAL | 0 refills | Status: DC | PRN

## 2023-04-06 MED ORDER — MECLIZINE HCL 50 MG PO TABS: 50.0000 mg | ORAL_TABLET | Freq: Three times a day (TID) | ORAL | 1 refills | Status: DC | PRN

## 2023-04-06 NOTE — Patient Instructions (Signed)

## 2023-04-06 NOTE — Telephone Encounter (Signed)
 Chief Complaint: dizziness Symptoms: dizziness and lightheaded Frequency: since yesterday Pertinent Negatives: Patient denies sob, chest pain,  Disposition: [] ED /[] Urgent Care (no appt availability in office) / [x] Appointment(In office/virtual)/ []  [facility]/ [] Home Care/ [] Refused Recommended Disposition /[] [facility]/ []  Follow-up with PCP Additional Notes: Patient states that since yesterday he has been having periods of dizziness. States that he is lightheaded and it is interfering with his daily activities.  He has trouble standing from sitting position. He experienced this once before about a year ago.   Copied from CRM [id number]. Topic: Clinical - Red Word Triage >> Apr 06, 2023 10:54 AM Marica Otter wrote: [facility] that prompted transfer to Nurse Triage: Patient states he's having dizzy spells. Reason for Disposition  [1] MODERATE dizziness (e.g., interferes with normal activities) AND [2] has NOT been evaluated by doctor (or NP/PA) for this  (Exception: Dizziness caused by heat exposure, sudden standing, or poor fluid intake.)  Answer Assessment - Initial Assessment Questions 1. DESCRIPTION: "Describe your dizziness."     Very lightheaded 2. LIGHTHEADED: "Do you feel lightheaded?" (e.g., somewhat faint, woozy, weak upon standing)     Yes lightheaded since yesterday 3. VERTIGO: "Do you feel like either you or the room is spinning or tilting?" (i.e. vertigo)     Yes room feels like its spinning 4. SEVERITY: "How bad is it?"  "Do you feel like you are going to faint?" "Can you stand and walk?"   - MILD: Feels slightly dizzy, but walking normally.   - MODERATE: Feels unsteady when walking, but not falling; interferes with normal activities (e.g., school, work).   - SEVERE: Unable to walk without falling, or requires assistance to walk without falling; feels like passing out now.      moderate 5. ONSET:  "When did the dizziness begin?"     yesterday 6.  AGGRAVATING FACTORS: "Does anything make it worse?" (e.g., standing, change in head position)     Standing, changing head direction 7. HEART RATE: "Can you tell me your heart rate?" "How many beats in 15 seconds?"  (Note: not all patients can do this)       Feel like heartbeat is normal 8. CAUSE: "What do you think is causing the dizziness?"     unknown 9. RECURRENT SYMPTOM: "Have you had dizziness before?" If Yes, ask: "When was the last time?" "What happened that time?"     A year ago 10. OTHER SYMPTOMS: "Do you have any other symptoms?" (e.g., fever, chest pain, vomiting, diarrhea, bleeding)       Some times feels nauseous with the dizzines  Protocols used: Dizziness - Lightheadedness-A-AH

## 2023-04-06 NOTE — Telephone Encounter (Signed)
 No further action needed.

## 2023-04-06 NOTE — Progress Notes (Signed)
 Patient ID: John Morrison, male  DOB: March 21, 1960, 63 y.o.   MRN: [id number] Patient Care Team    Relationship Specialty Notifications Start End  Natalia Leatherwood, DO PCP - General Family Medicine  03/27/20   Lanelle Bal, DO Consulting Physician Internal Medicine  04/24/20     Chief Complaint  Patient presents with   Dizziness    Since yesterday morning; dizziness, dull head pain, nausea. Pt denies falls. Pt has not tried to take anything to relieve symptoms.     Subjective: John Morrison is a 63 y.o. male present for acute concern All past medical history, surgical history, allergies, family history, immunizations, medications and social history were updated in the electronic medical record today. All recent labs, ED visits and hospitalizations within the last year were reviewed.  Patient reports yesterday morning he had dizziness. He also reports dull headache and nausea.  He denies any recent illness.  He reports he has a significant room spinning sensation accompanied by nausea when he moves his head in which in any direction, but more severely with neck extension. He reports he had syncope/presyncope on 1 occasion about a year ago, that was when straining when having a bowel movement.  He has not experienced persistent vertigo such as he has on this occasion. He states the head discomfort is intermittent over the last 24 hours.  And describes the discomfort as a dull pressure feeling in the occipital aspect of his head. He has chronic tinnitus in the left ear.  He denies any visual changes or photophobia. He endorses feeling off balanced.     04/02/2022   10:47 AM 09/15/2021    2:01 PM 03/31/2021    2:28 PM 10/30/2020   11:00 AM 03/27/2020    1:27 PM  Depression screen PHQ 2/9  Decreased Interest 0 0 0 0 0  Down, Depressed, Hopeless 0 0 0 0 0  PHQ - 2 Score 0 0 0 0 0       No data to display                04/02/2022   10:47 AM  Fall Risk   Falls in the past year? 0   Number falls in past yr: 0  Injury with Fall? 0  Follow up Falls evaluation completed    Immunization History  Administered Date(s) Administered   Moderna Sars-Covid-2 Vaccination 02/15/2020, 03/15/2020   Tdap 04/01/2017   Past Medical History:  Diagnosis Date   CKD (chronic kidney disease)    Hypertension    Kidney stone    No Known Allergies Past Surgical History:  Procedure Laterality Date   BIOPSY  07/01/2020   Procedure: BIOPSY;  Surgeon: Lanelle Bal, DO;  [facility];  Service: Endoscopy;;   COLONOSCOPY WITH PROPOFOL N/A 07/01/2020   Procedure: COLONOSCOPY WITH PROPOFOL;  Surgeon: Lanelle Bal, DO;  [facility];  Service: Endoscopy;  Laterality: N/A;  12:30pm   NO PAST SURGERIES     POLYPECTOMY  07/01/2020   Procedure: POLYPECTOMY;  Surgeon: Lanelle Bal, DO;  [facility];  Service: Endoscopy;;   Family History  Adopted: Yes  Problem Relation Age of Onset   Colon cancer Neg Hx    Social History   Social History Narrative   Marital status/children/pets: Married   Education/employment: High school education, works as a Location manager.   Safety:      -smoke alarm in the  home:Yes     - wears seatbelt: Yes     - Feels safe in their relationships: Yes    Allergies as of 04/06/2023   No Known Allergies      Medication List        Accurate as of April 06, 2023  3:38 PM. If you have any questions, ask your nurse or doctor.          lisinopril 40 MG tablet Commonly known as: ZESTRIL Take 1 tablet (40 mg total) by mouth daily.   meclizine 50 MG tablet Commonly known as: ANTIVERT Take 1 tablet (50 mg total) by mouth 3 (three) times daily as needed. Started by: Felix Pacini   ondansetron 4 MG disintegrating tablet Commonly known as: ZOFRAN-ODT Take 1 tablet (4 mg total) by mouth every 8 (eight) hours as needed for nausea or vomiting. Started by: Felix Pacini       All past medical history, surgical  history, allergies, family history, immunizations andmedications were updated in the EMR today and reviewed under the history and medication portions of their EMR.     Recent Results (from the past 2160 hours)  POC Influenza A&B (Binax test)     Status: Normal   Collection Time: 04/06/23  3:22 PM  Result Value Ref Range   Influenza A, POC Negative Negative   Influenza B, POC Negative Negative  POC COVID-19 BinaxNow     Status: Normal   Collection Time: 04/06/23  3:23 PM  Result Value Ref Range   SARS Coronavirus 2 Ag Negative Negative    No results found.  Review of Systems  Constitutional:  Negative for chills, diaphoresis, fever and malaise/fatigue.  HENT:  Positive for tinnitus (Chronic left). Negative for congestion, ear pain, sinus pain and sore throat.   Eyes: Negative.   Respiratory: Negative.    Cardiovascular: Negative.   Gastrointestinal:  Positive for nausea. Negative for abdominal pain, diarrhea and vomiting.  Genitourinary: Negative.   Musculoskeletal: Negative.   Skin:  Negative for rash.  Neurological:  Positive for dizziness and headaches. Negative for tingling, sensory change, speech change, focal weakness, loss of consciousness and weakness.   14 pt review of systems performed and negative (unless mentioned in an HPI)  Objective: BP 134/86   Pulse 60   Temp 98.1 F (36.7 C)   Wt 177 lb (80.3 kg)   SpO2 96%   BMI 28.11 kg/m  Physical Exam Vitals and nursing note reviewed.  Constitutional:      General: He is not in acute distress.    Appearance: Normal appearance. He is not ill-appearing, toxic-appearing or diaphoretic.  HENT:     Head: Normocephalic and atraumatic.  Eyes:     General: No scleral icterus.       Right eye: No discharge.        Left eye: No discharge.     Extraocular Movements: Extraocular movements intact.     Pupils: Pupils are equal, round, and reactive to light.  Cardiovascular:     Rate and Rhythm: Normal rate and regular  rhythm.  Pulmonary:     Effort: Pulmonary effort is normal. No respiratory distress.     Breath sounds: Normal breath sounds. No wheezing, rhonchi or rales.  Musculoskeletal:     Right lower leg: No edema.     Left lower leg: No edema.  Skin:    General: Skin is warm.     Findings: No rash.  Neurological:     Mental Status:  He is alert and oriented to person, place, and time. Mental status is at baseline.     Sensory: No sensory deficit.     Motor: No weakness.     Coordination: Coordination abnormal. Finger-Nose-Finger Test abnormal.     Gait: Gait abnormal.     Comments: Off balance with Romberg- able to correct Unable to lay flat due to severe sustained  vertigo-counterclockwise spin. Horizontal nystagmus present  Psychiatric:        Mood and Affect: Mood normal.        Behavior: Behavior normal.        Thought Content: Thought content normal.        Judgment: Judgment normal.    Results for orders placed or performed in visit on 04/06/23 (from the past 48 hours)  POC Influenza A&B (Binax test)     Status: Normal   Collection Time: 04/06/23  3:22 PM  Result Value Ref Range   Influenza A, POC Negative Negative   Influenza B, POC Negative Negative  POC COVID-19 BinaxNow     Status: Normal   Collection Time: 04/06/23  3:23 PM  Result Value Ref Range   SARS Coronavirus 2 Ag Negative Negative     Assessment/plan: John Morrison is a 63 y.o. male present for acute visit Dizziness/vertigo/headache He describes the symptoms as a counterclockwise spinning with head movement.  His symptoms today on exam are rather severe and he is unable to lay back on the exam table secondary to symptoms persisting.  Concerning he also describes occipital head pressure and discomfort. Attempting to order CT head stat.  Patient and his wife was told to go straight to the emergency room if symptoms worsen both and dizziness severity or headache, or syncope.  She report understanding. Meclizine 50 mg  3 times daily as needed prescribed. Zofran 3 times daily as needed prescribed - POC COVID-19 BinaxNow-negative - POC Influenza A&B (Binax test)-negative - CBC w/Diff - Comp Met (CMET) - CT HEAD WO CONTRAST ( ); Future>STAT  Return if symptoms worsen or fail to improve.  Orders Placed This Encounter  Procedures   CT HEAD WO CONTRAST ( )   CBC w/Diff   Comp Met (CMET)   POC COVID-19 BinaxNow   POC Influenza A&B (Binax test)   Meds ordered this encounter  Medications   lisinopril (ZESTRIL) 40 MG tablet    Sig: Take 1 tablet (40 mg total) by mouth daily.    Dispense:  90 tablet    Refill:  0   ondansetron (ZOFRAN-ODT) 4 MG disintegrating tablet    Sig: Take 1 tablet (4 mg total) by mouth every 8 (eight) hours as needed for nausea or vomiting.    Dispense:  20 tablet    Refill:  0   meclizine (ANTIVERT) 50 MG tablet    Sig: Take 1 tablet (50 mg total) by mouth 3 (three) times daily as needed.    Dispense:  90 tablet    Refill:  1   Referral Orders  No referral(s) requested today     Note is dictated utilizing voice recognition software. Although note has been proof read prior to signing, occasional typographical errors still can be missed. If any questions arise, please do not hesitate to call for verification.  Electronically signed by: Felix Pacini, DO [facility] [place]

## 2023-04-07 ENCOUNTER — Telehealth: Payer: Self-pay | Admitting: Family Medicine

## 2023-04-07 LAB — CBC WITH DIFFERENTIAL/PLATELET
Basophils Absolute: 0 10*3/uL (ref 0.0–0.1)
Basophils Relative: 0.8 % (ref 0.0–3.0)
Eosinophils Absolute: 0.1 10*3/uL (ref 0.0–0.7)
Eosinophils Relative: 1.8 % (ref 0.0–5.0)
HCT: 43.2 % (ref 39.0–52.0)
Hemoglobin: 15 g/dL (ref 13.0–17.0)
Lymphocytes Relative: 22.2 % (ref 12.0–46.0)
Lymphs Abs: 1.3 10*3/uL (ref 0.7–4.0)
MCHC: 34.6 g/dL (ref 30.0–36.0)
MCV: 84.4 fl (ref 78.0–100.0)
Monocytes Absolute: 0.4 10*3/uL (ref 0.1–1.0)
Monocytes Relative: 6.2 % (ref 3.0–12.0)
Neutro Abs: 4 10*3/uL (ref 1.4–7.7)
Neutrophils Relative %: 69 % (ref 43.0–77.0)
Platelets: 142 10*3/uL — ABNORMAL LOW (ref 150.0–400.0)
RBC: 5.12 Mil/uL (ref 4.22–5.81)
RDW: 13.5 % (ref 11.5–15.5)
WBC: 5.9 10*3/uL (ref 4.0–10.5)

## 2023-04-07 LAB — COMPREHENSIVE METABOLIC PANEL
ALT: 13 U/L (ref 0–53)
AST: 12 U/L (ref 0–37)
Albumin: 4.4 g/dL (ref 3.5–5.2)
Alkaline Phosphatase: 67 U/L (ref 39–117)
BUN: 18 mg/dL (ref 6–23)
CO2: 30 meq/L (ref 19–32)
Calcium: 9.4 mg/dL (ref 8.4–10.5)
Chloride: 105 meq/L (ref 96–112)
Creatinine, Ser: 1.15 mg/dL (ref 0.40–1.50)
GFR: 68.85 mL/min (ref >60.00)
Glucose, Bld: 102 mg/dL — ABNORMAL HIGH (ref 70–99)
Potassium: 4.3 meq/L (ref 3.5–5.1)
Sodium: 140 meq/L (ref 135–145)
Total Bilirubin: 0.4 mg/dL (ref 0.2–1.2)
Total Protein: 7 g/dL (ref 6.0–8.3)

## 2023-04-07 NOTE — Telephone Encounter (Signed)
 Pt advised of results. Work note printed and placed up front

## 2023-04-07 NOTE — Telephone Encounter (Signed)
 Please call pt His head CT is normal. This is great news Unfortunately, this is vertigo-BPPV.  Take the meclizine as prescribed and zofran if needed.  Rest and hydrate well.   If needs a work excuse we can provide one to him (1 week max).  If symtpoms do not start to improve, we would need to consider referral to neurology and/or vestibular rehab, which is physical therapy to help with vertigo.

## 2023-04-13 ENCOUNTER — Encounter: Payer: BC Managed Care – PPO | Admitting: Family Medicine

## 2023-05-11 ENCOUNTER — Encounter: Payer: Self-pay | Admitting: Family Medicine

## 2023-05-11 ENCOUNTER — Ambulatory Visit (INDEPENDENT_AMBULATORY_CARE_PROVIDER_SITE_OTHER): Admitting: Family Medicine

## 2023-05-11 VITALS — BP 136/86 | HR 70 | Temp 98.2°F | Ht 66.0 in | Wt 179.4 lb

## 2023-05-11 DIAGNOSIS — Z0000 Encounter for general adult medical examination without abnormal findings: Secondary | ICD-10-CM

## 2023-05-11 DIAGNOSIS — Z125 Encounter for screening for malignant neoplasm of prostate: Secondary | ICD-10-CM | POA: Diagnosis not present

## 2023-05-11 DIAGNOSIS — E782 Mixed hyperlipidemia: Secondary | ICD-10-CM

## 2023-05-11 DIAGNOSIS — R748 Abnormal levels of other serum enzymes: Secondary | ICD-10-CM | POA: Diagnosis not present

## 2023-05-11 DIAGNOSIS — I10 Essential (primary) hypertension: Secondary | ICD-10-CM | POA: Diagnosis not present

## 2023-05-11 DIAGNOSIS — K047 Periapical abscess without sinus: Secondary | ICD-10-CM | POA: Diagnosis not present

## 2023-05-11 LAB — LIPID PANEL
Cholesterol: 143 mg/dL (ref 0–200)
HDL: 37.7 mg/dL — ABNORMAL LOW (ref >39.00)
LDL Cholesterol: 82 mg/dL (ref 0–99)
NonHDL: 105.71
Total CHOL/HDL Ratio: 4
Triglycerides: 120 mg/dL (ref 0.0–149.0)
VLDL: 24 mg/dL (ref 0.0–40.0)

## 2023-05-11 LAB — COMPREHENSIVE METABOLIC PANEL WITH GFR
ALT: 66 U/L — ABNORMAL HIGH (ref 0–53)
AST: 51 U/L — ABNORMAL HIGH (ref 0–37)
Albumin: 4.2 g/dL (ref 3.5–5.2)
Alkaline Phosphatase: 69 U/L (ref 39–117)
BUN: 20 mg/dL (ref 6–23)
CO2: 29 meq/L (ref 19–32)
Calcium: 9.1 mg/dL (ref 8.4–10.5)
Chloride: 107 meq/L (ref 96–112)
Creatinine, Ser: 1.06 mg/dL (ref 0.40–1.50)
GFR: 75.88 mL/min (ref >60.00)
Glucose, Bld: 96 mg/dL (ref 70–99)
Potassium: 4.2 meq/L (ref 3.5–5.1)
Sodium: 140 meq/L (ref 135–145)
Total Bilirubin: 0.4 mg/dL (ref 0.2–1.2)
Total Protein: 6.6 g/dL (ref 6.0–8.3)

## 2023-05-11 LAB — CBC
HCT: 41.1 % (ref 39.0–52.0)
Hemoglobin: 14 g/dL (ref 13.0–17.0)
MCHC: 34.1 g/dL (ref 30.0–36.0)
MCV: 83.3 fl (ref 78.0–100.0)
Platelets: 172 10*3/uL (ref 150.0–400.0)
RBC: 4.94 Mil/uL (ref 4.22–5.81)
RDW: 13.5 % (ref 11.5–15.5)
WBC: 4.5 10*3/uL (ref 4.0–10.5)

## 2023-05-11 LAB — TSH: TSH: 2.97 u[IU]/mL (ref 0.35–5.50)

## 2023-05-11 LAB — HEMOGLOBIN A1C: Hgb A1c MFr Bld: 5.2 % (ref 4.6–6.5)

## 2023-05-11 LAB — PSA: PSA: 1.44 ng/mL (ref 0.10–4.00)

## 2023-05-11 MED ORDER — AMOXICILLIN-POT CLAVULANATE 875-125 MG PO TABS: 1.0000 | ORAL_TABLET | Freq: Two times a day (BID) | ORAL | 0 refills | Status: DC

## 2023-05-11 MED ORDER — CHLORHEXIDINE GLUCONATE 0.12 % MT SOLN: 15.0000 mL | Freq: Two times a day (BID) | OROMUCOSAL | 0 refills | Status: DC

## 2023-05-11 MED ORDER — AMLODIPINE BESYLATE 2.5 MG PO TABS: 2.5000 mg | ORAL_TABLET | Freq: Every day | ORAL | 1 refills | Status: DC

## 2023-05-11 MED ORDER — LISINOPRIL 40 MG PO TABS: 40.0000 mg | ORAL_TABLET | Freq: Every day | ORAL | 1 refills | Status: DC

## 2023-05-11 NOTE — Patient Instructions (Addendum)
 Return in about 25 weeks (around 11/02/2023) for Routine chronic condition follow-up.        Great to see you today.  I have refilled the medication(s) we provide.   If labs were collected or images ordered, we will inform you of  results once we have received them and reviewed. We will contact you either by echart message, or telephone call.  Please give ample time to the testing facility, and our office to run,  receive and review results. Please do not call inquiring of results, even if you can see them in your chart. We will contact you as soon as we are able. If it has been over 1 week since the test was completed, and you have not yet heard from us , then please call us .    - echart message- for normal results that have been seen by the patient already.   - telephone call: abnormal results or if patient has not viewed results in their echart.  If a referral to a specialist was entered for you, please call us  in 2 weeks if you have not heard from the specialist office to schedule.

## 2023-05-11 NOTE — Progress Notes (Signed)
 Patient ID: John Morrison, male  DOB: [birth year], 63 y.o.   MRN: [id number] Patient Care Team    Relationship Specialty Notifications Start End  Natalia Leatherwood, DO PCP - General Family Medicine  03/27/20   Lanelle Bal, DO Consulting Physician Internal Medicine  04/24/20     Chief Complaint  Patient presents with   Annual Exam    Chronic Conditions/illness Management.   Pt is fasting.      Subjective:  John Morrison is a 63 y.o. male present for CPE and Chronic Conditions/illness Management  All past medical history, surgical history, allergies, family history, immunizations, medications and social history were updated in the electronic medical record today. All recent labs, ED visits and hospitalizations within the last year were reviewed.  Health maintenance:  Colonoscopy: last screen 07/01/2020- 5 yr,Completed by Dr. Marletta Lor Immunizations:  tdap UTD 2019, influenza declined, shingrix declined Infectious disease screening: HIV and Hep C declined.  PSA:  Lab Results  Component Value Date   PSA 1.27 04/02/2022   PSA 0.81 03/31/2021   PSA 0.57 03/27/2020  , pt was counseled on prostate cancer screenings.  Assistive device: none Oxygen use: None Patient has a Dental home. Hospitalizations/ED visits: reviewed  HTN/HLD: Pt reports compliance with lisinopril 40 mg qd.  Patient denies chest pain, shortness of breath, dizziness or lower extremity edema.   Patient reports he has had dental pain on his right lower jawline for 2-3 days.  He reports he is calling his dentist today to make an appointment.  He denies fevers or chills.  He states his jaw is swollen.     05/11/2023   11:10 AM 04/02/2022   10:47 AM 09/15/2021    2:01 PM 03/31/2021    2:28 PM 10/30/2020   11:00 AM  Depression screen PHQ 2/9  Decreased Interest 0 0 0 0 0  Down, Depressed, Hopeless 0 0 0 0 0  PHQ - 2 Score 0 0 0 0 0  Altered sleeping 0      Tired, decreased energy 0      Change in appetite 0       Feeling bad or failure about yourself  0      Trouble concentrating 0      Moving slowly or fidgety/restless 0      Suicidal thoughts 0      PHQ-9 Score 0      Difficult doing work/chores Not difficult at all          05/11/2023   11:11 AM  GAD 7 : Generalized Anxiety Score  Nervous, Anxious, on Edge 0  Control/stop worrying 0  Worry too much - different things 0  Trouble relaxing 0  Restless 0  Easily annoyed or irritable 0  Afraid - awful might happen 0  Total GAD 7 Score 0  Anxiety Difficulty Not difficult at all           05/11/2023   11:10 AM 04/02/2022   10:47 AM  Fall Risk   Falls in the past year? 0 0  Number falls in past yr:  0  Injury with Fall?  0  Follow up Falls evaluation completed Falls evaluation completed    Immunization History  Administered Date(s) Administered   Moderna Sars-Covid-2 Vaccination 02/15/2020, 03/15/2020   Tdap 04/01/2017   Past Medical History:  Diagnosis Date   CKD (chronic kidney disease)    Hypertension    Kidney stone    No  Known Allergies Past Surgical History:  Procedure Laterality Date   BIOPSY  07/01/2020   Procedure: BIOPSY;  Surgeon: Lanelle Bal, DO;  [facility];  Service: Endoscopy;;   COLONOSCOPY WITH PROPOFOL N/A 07/01/2020   Procedure: COLONOSCOPY WITH PROPOFOL;  Surgeon: Lanelle Bal, DO;  [facility];  Service: Endoscopy;  Laterality: N/A;  12:30pm   NO PAST SURGERIES     POLYPECTOMY  07/01/2020   Procedure: POLYPECTOMY;  Surgeon: Lanelle Bal, DO;  [facility];  Service: Endoscopy;;   Family History  Adopted: Yes  Problem Relation Age of Onset   Colon cancer Neg Hx    Social History   Social History Narrative   Marital status/children/pets: Married   Education/employment: High school education, works as a Location manager.   Safety:      -smoke alarm in the home:Yes     - wears seatbelt: Yes     - Feels safe in their relationships: Yes    Allergies  as of 05/11/2023   No Known Allergies      Medication List        Accurate as of May 11, 2023 11:22 AM. If you have any questions, ask your nurse or doctor.          STOP taking these medications    meclizine 50 MG tablet Commonly known as: ANTIVERT Stopped by: Felix Pacini   ondansetron 4 MG disintegrating tablet Commonly known as: ZOFRAN-ODT Stopped by: Felix Pacini       TAKE these medications    lisinopril 40 MG tablet Commonly known as: ZESTRIL Take 1 tablet (40 mg total) by mouth daily.       All past medical history, surgical history, allergies, family history, immunizations andmedications were updated in the EMR today and reviewed under the history and medication portions of their EMR.      No results found.  ROS 14 pt review of systems performed and negative (unless mentioned in an HPI)  Objective: BP 136/86   Pulse 70   Temp 98.2 F (36.8 C)   Ht 5\' 6"  (1.676 m)   Wt 179 lb 6.4 oz (81.4 kg)   SpO2 97%   BMI 28.96 kg/m  Physical Exam Vitals and nursing note reviewed.  Constitutional:      General: He is not in acute distress.    Appearance: Normal appearance. He is not ill-appearing, toxic-appearing or diaphoretic.  HENT:     Head: Normocephalic and atraumatic.     Right Ear: Tympanic membrane, ear canal and external ear normal. There is no impacted cerumen.     Left Ear: Tympanic membrane, ear canal and external ear normal. There is no impacted cerumen.     Nose: Nose normal. No congestion or rhinorrhea.     Mouth/Throat:     Mouth: Mucous membranes are moist.     Dentition: Dental tenderness, gingival swelling and dental caries present.     Pharynx: Oropharynx is clear. No oropharyngeal exudate or posterior oropharyngeal erythema.  Eyes:     General: No scleral icterus.       Right eye: No discharge.        Left eye: No discharge.     Extraocular Movements: Extraocular movements intact.     Pupils: Pupils are equal, round, and  reactive to light.  Cardiovascular:     Rate and Rhythm: Normal rate and regular rhythm.     Pulses: Normal pulses.  Heart sounds: Normal heart sounds. No murmur heard.    No friction rub. No gallop.  Pulmonary:     Effort: Pulmonary effort is normal. No respiratory distress.     Breath sounds: Normal breath sounds. No stridor. No wheezing, rhonchi or rales.  Chest:     Chest wall: No tenderness.  Abdominal:     General: Abdomen is flat. Bowel sounds are normal. There is no distension.     Palpations: Abdomen is soft. There is no mass.     Tenderness: There is no abdominal tenderness. There is no right CVA tenderness, left CVA tenderness, guarding or rebound.     Hernia: No hernia is present.  Musculoskeletal:        General: No swelling or tenderness. Normal range of motion.     Cervical back: Normal range of motion and neck supple.     Right lower leg: No edema.     Left lower leg: No edema.  Lymphadenopathy:     Cervical: No cervical adenopathy.  Skin:    General: Skin is warm and dry.     Coloration: Skin is not jaundiced.     Findings: No bruising, lesion or rash.  Neurological:     General: No focal deficit present.     Mental Status: He is alert and oriented to person, place, and time. Mental status is at baseline.     Cranial Nerves: No cranial nerve deficit.     Sensory: No sensory deficit.     Motor: No weakness.     Coordination: Coordination normal.     Gait: Gait normal.     Deep Tendon Reflexes: Reflexes normal.  Psychiatric:        Mood and Affect: Mood normal.        Behavior: Behavior normal.        Thought Content: Thought content normal.        Judgment: Judgment normal.     No results found.  Assessment/plan: DEMORIO SEELEY is a 63 y.o. male present for CPE and Chronic Conditions/illness Management Primary hypertension/HLD Above goal of < 130/80 Added amlodipine 2.5 mg every day- has been elevated last few visits above goal.  Continue   lisinopril 40 mg QD.  Low sodium diet- heart healthy CBC, CMP, TSH and lipids collected today  Prostate cancer screening - PSA  Anterolisthesis of lumbar spine/Spondylolisthesis of lumbar region/Chronic bilateral low back pain without sciatica He had FMLA paperwork filled out in the past by [employer] (many years ago). Agreed to fill out for him yearly if needed  Dental abscess: Patient complains of dental pain of 2-3 days with possible dental abscess right lower jaw.  Exam is consistent with possible dental abscess. Agreed to call in a 7-day course of antibiotic for him, with stipulation that he must follow-up with his dental team. Patient reported understanding agreed to plan  Routine general medical examination at a health care facility Patient was encouraged to exercise greater than 150 minutes a week. Patient was encouraged to choose a diet filled with fresh fruits and vegetables, and lean meats. AVS provided to patient today for education/recommendation on gender specific health and safety maintenance. Colonoscopy: last screen 07/01/2020- 5 yr,Completed by Dr. Marletta Lor Immunizations:  tdap UTD 2019, influenza declined, shingrix declined Infectious disease screening: HIV and Hep C declined.  PSA: collected today  Return in about 25 weeks (around 11/02/2023) for Routine chronic condition follow-up.  Orders Placed This Encounter  Procedures   CBC  Comprehensive metabolic panel with GFR   Hemoglobin A1c   Lipid panel   PSA   TSH   No orders of the defined types were placed in this encounter.  Referral Orders  No referral(s) requested today     Note is dictated utilizing voice recognition software. Although note has been proof read prior to signing, occasional typographical errors still can be missed. If any questions arise, please do not hesitate to call for verification.  Electronically signed by: Napolean Backbone, DO [facility] [place]

## 2023-05-12 NOTE — Addendum Note (Signed)
 Addended by: Margaretta Shaw A on: 05/12/2023 01:37 PM   Modules accepted: Orders

## 2023-05-28 ENCOUNTER — Telehealth: Payer: Self-pay

## 2023-05-28 NOTE — Telephone Encounter (Signed)
 Patient has dropped off FMLA form to be filled out. Stated these forms are completed by our office every year. Forms can be found in provider box at front desk. 05/28/2023 at 11:15am.   Please make patient aware when forms has been completed.

## 2023-05-30 ENCOUNTER — Other Ambulatory Visit: Payer: Self-pay | Admitting: Family Medicine

## 2023-05-31 NOTE — Telephone Encounter (Signed)
Placed in PCP office for review/signature

## 2023-06-03 DIAGNOSIS — Z0279 Encounter for issue of other medical certificate: Secondary | ICD-10-CM

## 2023-06-03 NOTE — Telephone Encounter (Signed)
 Completed FMLA paperwork renewal and placed on [employer] work basket

## 2023-06-03 NOTE — Telephone Encounter (Signed)
 Spoke with pt's wife; she is made aware forms are completed. Forms placed in folder upfront for pt pickup.

## 2023-06-10 ENCOUNTER — Other Ambulatory Visit (INDEPENDENT_AMBULATORY_CARE_PROVIDER_SITE_OTHER)

## 2023-06-10 DIAGNOSIS — R748 Abnormal levels of other serum enzymes: Secondary | ICD-10-CM | POA: Diagnosis not present

## 2023-06-10 LAB — HEPATIC FUNCTION PANEL
ALT: 20 U/L (ref 0–53)
AST: 21 U/L (ref 0–37)
Albumin: 4.3 g/dL (ref 3.5–5.2)
Alkaline Phosphatase: 72 U/L (ref 39–117)
Bilirubin, Direct: 0.1 mg/dL (ref 0.0–0.3)
Total Bilirubin: 0.7 mg/dL (ref 0.2–1.2)
Total Protein: 6.7 g/dL (ref 6.0–8.3)

## 2023-06-11 ENCOUNTER — Ambulatory Visit: Payer: Self-pay | Admitting: Family Medicine

## 2023-10-30 ENCOUNTER — Other Ambulatory Visit: Payer: Self-pay | Admitting: Family Medicine

## 2023-11-01 NOTE — Telephone Encounter (Signed)
 Pt has OV on 10/7

## 2023-11-01 NOTE — Patient Instructions (Incomplete)

## 2023-11-01 NOTE — Progress Notes (Unsigned)
 Patient ID: John Morrison, male  DOB: 1960/04/15, 63 y.o.   MRN: [id number] Patient Care Team    Relationship Specialty Notifications Start End  Catherine Charlies LABOR, DO PCP - General Family Medicine  03/27/20   Cindie Carlin POUR, DO Consulting Physician Internal Medicine  04/24/20     No chief complaint on file.   Subjective:  John Morrison is a 63 y.o. male present for Chronic Conditions/illness Management  All past medical history, surgical history, allergies, family history, immunizations, medications and social history were updated in the electronic medical record today. All recent labs, ED visits and hospitalizations within the last year were reviewed.   HTN/HLD: Pt reports compliance with lisinopril  40 mg qd and amlodipine  2.5 mg which was added last visit..  Patient denies chest pain, shortness of breath, dizziness or lower extremity edema.       05/11/2023   11:10 AM 04/02/2022   10:47 AM 09/15/2021    2:01 PM 03/31/2021    2:28 PM 10/30/2020   11:00 AM  Depression screen PHQ 2/9  Decreased Interest 0 0 0 0 0  Down, Depressed, Hopeless 0 0 0 0 0  PHQ - 2 Score 0 0 0 0 0  Altered sleeping 0      Tired, decreased energy 0      Change in appetite 0      Feeling bad or failure about yourself  0      Trouble concentrating 0      Moving slowly or fidgety/restless 0      Suicidal thoughts 0      PHQ-9 Score 0      Difficult doing work/chores Not difficult at all          05/11/2023   11:11 AM  GAD 7 : Generalized Anxiety Score  Nervous, Anxious, on Edge 0  Control/stop worrying 0  Worry too much - different things 0  Trouble relaxing 0  Restless 0  Easily annoyed or irritable 0  Afraid - awful might happen 0  Total GAD 7 Score 0  Anxiety Difficulty Not difficult at all           05/11/2023   11:10 AM 04/02/2022   10:47 AM  Fall Risk   Falls in the past year? 0 0  Number falls in past yr:  0  Injury with Fall?  0  Follow up Falls evaluation completed Falls  evaluation completed    Immunization History  Administered Date(s) Administered   Moderna Sars-Covid-2 Vaccination 02/15/2020, 03/15/2020   Tdap 04/01/2017   Past Medical History:  Diagnosis Date   CKD (chronic kidney disease)    Hypertension    Kidney stone    No Known Allergies Past Surgical History:  Procedure Laterality Date   BIOPSY  07/01/2020   Procedure: BIOPSY;  Surgeon: Cindie Carlin POUR, DO;  Location: [facility];  Service: Endoscopy;;   COLONOSCOPY WITH PROPOFOL  N/A 07/01/2020   Procedure: COLONOSCOPY WITH PROPOFOL ;  Surgeon: Cindie Carlin POUR, DO;  Location: [facility];  Service: Endoscopy;  Laterality: N/A;  12:30pm   NO PAST SURGERIES     POLYPECTOMY  07/01/2020   Procedure: POLYPECTOMY;  Surgeon: Cindie Carlin POUR, DO;  Location: [facility];  Service: Endoscopy;;   Family History  Adopted: Yes  Problem Relation Age of Onset   Colon cancer Neg Hx    Social History   Social History Narrative   Marital status/children/pets: Married  Education/employment: High school education, works as a Location manager.   Safety:      -smoke alarm in the home:Yes     - wears seatbelt: Yes     - Feels safe in their relationships: Yes    Allergies as of 11/02/2023   No Known Allergies      Medication List        Accurate as of November 01, 2023 12:44 PM. If you have any questions, ask your nurse or doctor.          amLODipine  2.5 MG tablet Commonly known as: NORVASC  Take 1 tablet (2.5 mg total) by mouth daily.   amoxicillin -clavulanate 875-125 MG tablet Commonly known as: AUGMENTIN  Take 1 tablet by mouth 2 (two) times daily.   chlorhexidine  0.12 % solution Commonly known as: PERIDEX  Use as directed 15 mLs in the mouth or throat 2 (two) times daily.   lisinopril  40 MG tablet Commonly known as: ZESTRIL  Take 1 tablet (40 mg total) by mouth daily.       All past medical history, surgical history, allergies, family history, immunizations  andmedications were updated in the EMR today and reviewed under the history and medication portions of their EMR.      No results found.  Review of Systems  All other systems reviewed and are negative.  14 pt review of systems performed and negative (unless mentioned in an HPI)  Objective: There were no vitals taken for this visit. Physical Exam Vitals and nursing note reviewed. Exam conducted with a chaperone present.  Constitutional:      General: He is not in acute distress.    Appearance: Normal appearance. He is not ill-appearing, toxic-appearing or diaphoretic.  HENT:     Head: Normocephalic and atraumatic.  Eyes:     General: No scleral icterus.       Right eye: No discharge.        Left eye: No discharge.     Extraocular Movements: Extraocular movements intact.     Pupils: Pupils are equal, round, and reactive to light.  Cardiovascular:     Rate and Rhythm: Normal rate and regular rhythm.     Heart sounds: No murmur heard. Pulmonary:     Effort: Pulmonary effort is normal. No respiratory distress.     Breath sounds: Normal breath sounds. No wheezing, rhonchi or rales.  Musculoskeletal:     Right lower leg: No edema.     Left lower leg: No edema.  Skin:    General: Skin is warm.     Findings: No rash.  Neurological:     Mental Status: He is alert and oriented to person, place, and time. Mental status is at baseline.  Psychiatric:        Mood and Affect: Mood normal.        Behavior: Behavior normal.        Thought Content: Thought content normal.        Judgment: Judgment normal.     No results found.  Assessment/plan: John Morrison is a 63 y.o. male present for Chronic Conditions/illness Management Primary hypertension/HLD Above goal of < 130/80 ***Added amlodipine  2.5 mg every day- has been elevated last few visits above goal.  Continue lisinopril  40 mg QD.  Low sodium diet- heart healthy Labs due next visit  Anterolisthesis of lumbar  spine/Spondylolisthesis of lumbar region/Chronic bilateral low back pain without sciatica He had FMLA paperwork filled out in the past by [employer] (many years ago). Agreed to  fill out for him yearly if needed   No follow-ups on file.  No orders of the defined types were placed in this encounter.  No orders of the defined types were placed in this encounter.  Referral Orders  No referral(s) requested today     Note is dictated utilizing voice recognition software. Although note has been proof read prior to signing, occasional typographical errors still can be missed. If any questions arise, please do not hesitate to call for verification.  Electronically signed by: Charlies Bellini, DO [facility] [place]

## 2023-11-02 ENCOUNTER — Ambulatory Visit: Admitting: Family Medicine

## 2023-11-02 ENCOUNTER — Encounter: Payer: Self-pay | Admitting: Family Medicine

## 2023-11-02 VITALS — BP 128/70 | HR 66 | Temp 98.2°F | Wt 180.4 lb

## 2023-11-02 DIAGNOSIS — E782 Mixed hyperlipidemia: Secondary | ICD-10-CM

## 2023-11-02 DIAGNOSIS — I10 Essential (primary) hypertension: Secondary | ICD-10-CM

## 2023-11-02 MED ORDER — LISINOPRIL 40 MG PO TABS: 40.0000 mg | ORAL_TABLET | Freq: Every day | ORAL | 1 refills | Status: AC

## 2023-11-02 MED ORDER — AMLODIPINE BESYLATE 2.5 MG PO TABS: 2.5000 mg | ORAL_TABLET | Freq: Every day | ORAL | 1 refills | Status: AC

## 2024-05-12 ENCOUNTER — Encounter: Admitting: Family Medicine
